# Patient Record
Sex: Female | Born: 1970 | Race: White | Hispanic: No | Smoking: Former smoker
Health system: Southern US, Community
[De-identification: ages and names within clinical notes are randomized; demographics above are authoritative.]

## PROBLEM LIST (undated history)

## (undated) DIAGNOSIS — F32A Depression, unspecified: Secondary | ICD-10-CM

## (undated) DIAGNOSIS — F329 Major depressive disorder, single episode, unspecified: Secondary | ICD-10-CM

## (undated) DIAGNOSIS — F419 Anxiety disorder, unspecified: Secondary | ICD-10-CM

## (undated) HISTORY — PX: TONSILLECTOMY: SUR1361

## (undated) HISTORY — PX: HAND SURGERY: SHX662

---

## 2004-05-21 ENCOUNTER — Ambulatory Visit (HOSPITAL_COMMUNITY): Admission: RE | Admit: 2004-05-21 | Discharge: 2004-05-21 | Payer: Self-pay | Admitting: Gynecology

## 2004-05-22 ENCOUNTER — Ambulatory Visit (HOSPITAL_COMMUNITY): Admission: RE | Admit: 2004-05-22 | Discharge: 2004-05-22 | Payer: Self-pay | Admitting: Gynecology

## 2004-10-07 ENCOUNTER — Emergency Department: Payer: Self-pay | Admitting: Unknown Physician Specialty

## 2005-02-02 ENCOUNTER — Emergency Department: Payer: Self-pay | Admitting: Unknown Physician Specialty

## 2005-07-04 ENCOUNTER — Ambulatory Visit: Payer: Self-pay | Admitting: Internal Medicine

## 2005-07-04 ENCOUNTER — Observation Stay: Payer: Self-pay | Admitting: Surgery

## 2006-08-06 ENCOUNTER — Ambulatory Visit: Payer: Self-pay | Admitting: Internal Medicine

## 2006-08-11 ENCOUNTER — Ambulatory Visit: Payer: Self-pay | Admitting: Internal Medicine

## 2008-06-30 HISTORY — PX: TUBAL LIGATION: SHX77

## 2008-10-19 ENCOUNTER — Encounter: Payer: Self-pay | Admitting: Maternal & Fetal Medicine

## 2008-11-30 ENCOUNTER — Encounter: Payer: Self-pay | Admitting: Obstetrics and Gynecology

## 2009-01-30 ENCOUNTER — Observation Stay: Payer: Self-pay

## 2009-02-19 ENCOUNTER — Ambulatory Visit: Payer: Self-pay | Admitting: Advanced Practice Midwife

## 2009-02-21 ENCOUNTER — Emergency Department: Payer: Self-pay | Admitting: Emergency Medicine

## 2009-02-28 ENCOUNTER — Ambulatory Visit: Payer: Self-pay | Admitting: Advanced Practice Midwife

## 2009-03-25 ENCOUNTER — Observation Stay: Payer: Self-pay | Admitting: Unknown Physician Specialty

## 2009-03-29 ENCOUNTER — Encounter: Payer: Self-pay | Admitting: Obstetrics and Gynecology

## 2009-03-30 ENCOUNTER — Ambulatory Visit: Payer: Self-pay | Admitting: Advanced Practice Midwife

## 2009-04-02 ENCOUNTER — Encounter: Payer: Self-pay | Admitting: Obstetrics and Gynecology

## 2009-04-05 ENCOUNTER — Encounter: Payer: Self-pay | Admitting: Obstetrics & Gynecology

## 2009-04-09 ENCOUNTER — Encounter: Payer: Self-pay | Admitting: Obstetrics & Gynecology

## 2009-04-12 ENCOUNTER — Encounter: Payer: Self-pay | Admitting: Obstetrics and Gynecology

## 2009-04-16 ENCOUNTER — Encounter: Payer: Self-pay | Admitting: Maternal & Fetal Medicine

## 2009-04-19 ENCOUNTER — Inpatient Hospital Stay: Payer: Self-pay

## 2010-05-19 IMAGING — US ULTRAOUND OB LIMITED - [facility]PT [facility]
1 series · 8 of 8 positions shown · non-contrast
Comparison: none

[Series 1: ultraound ob limited - [facility] · 8 of 8 slices shown]
[im 1/8]
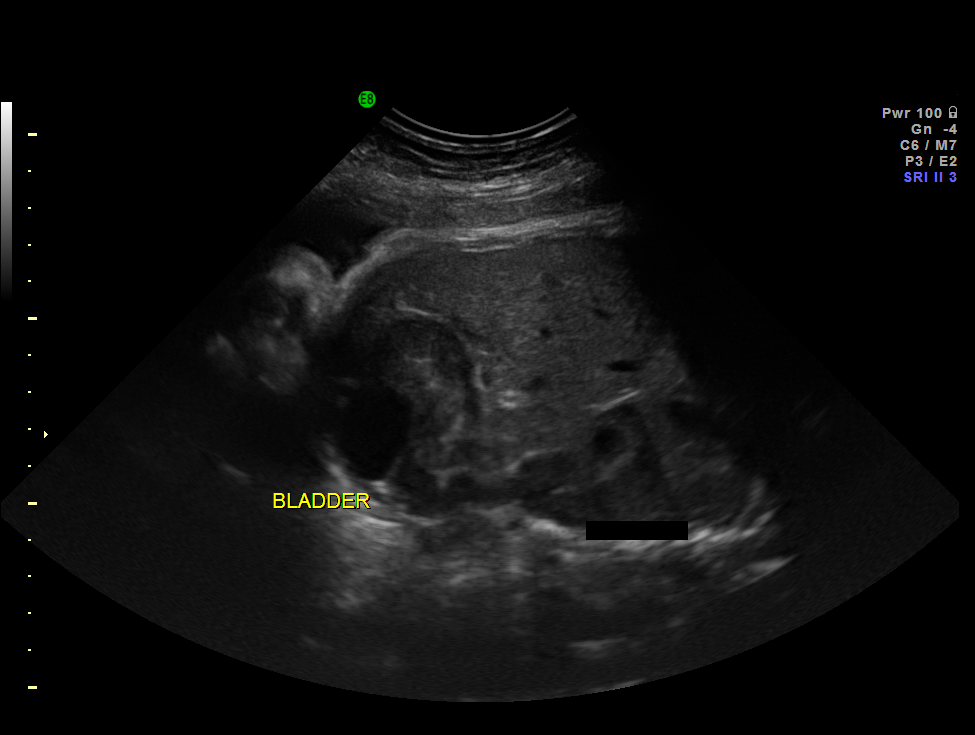
[im 2/8]
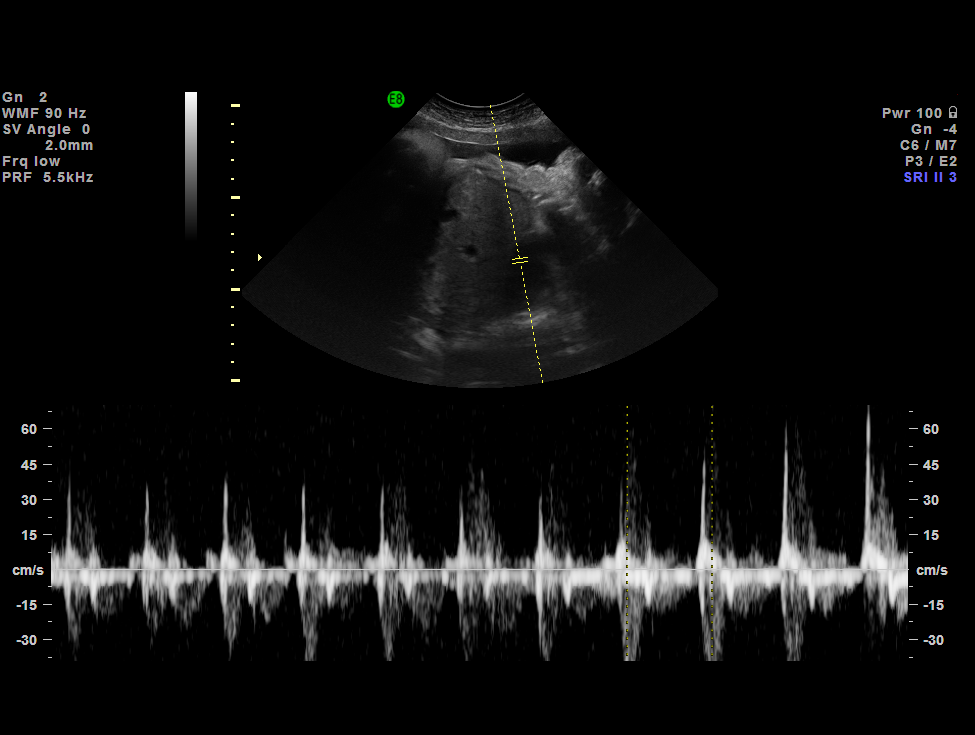
[im 3/8]
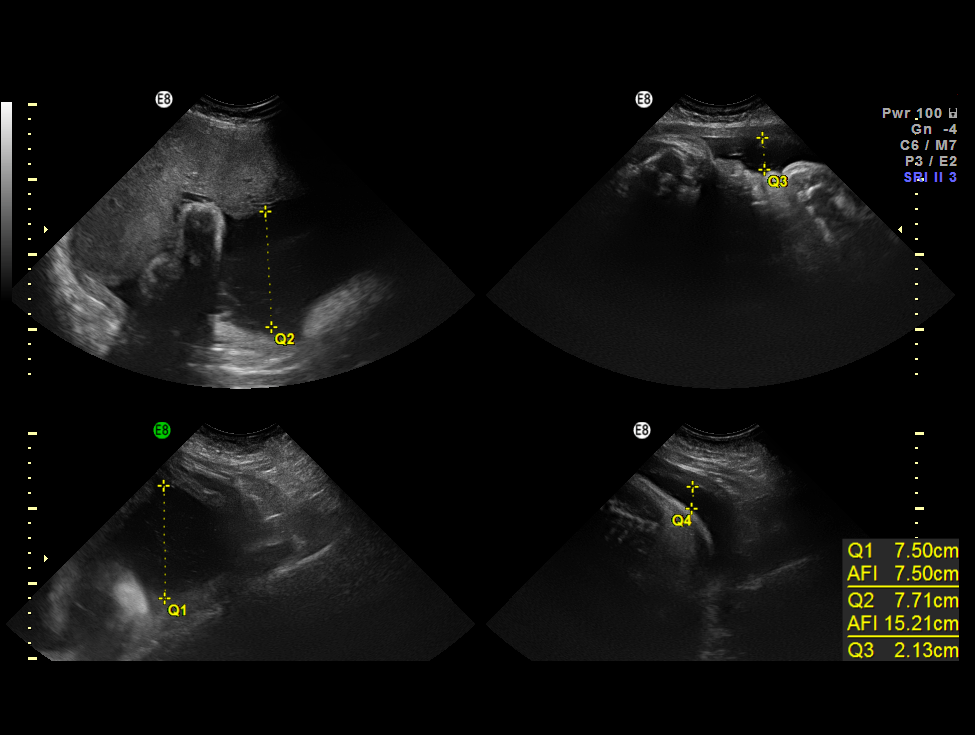
[im 4/8]
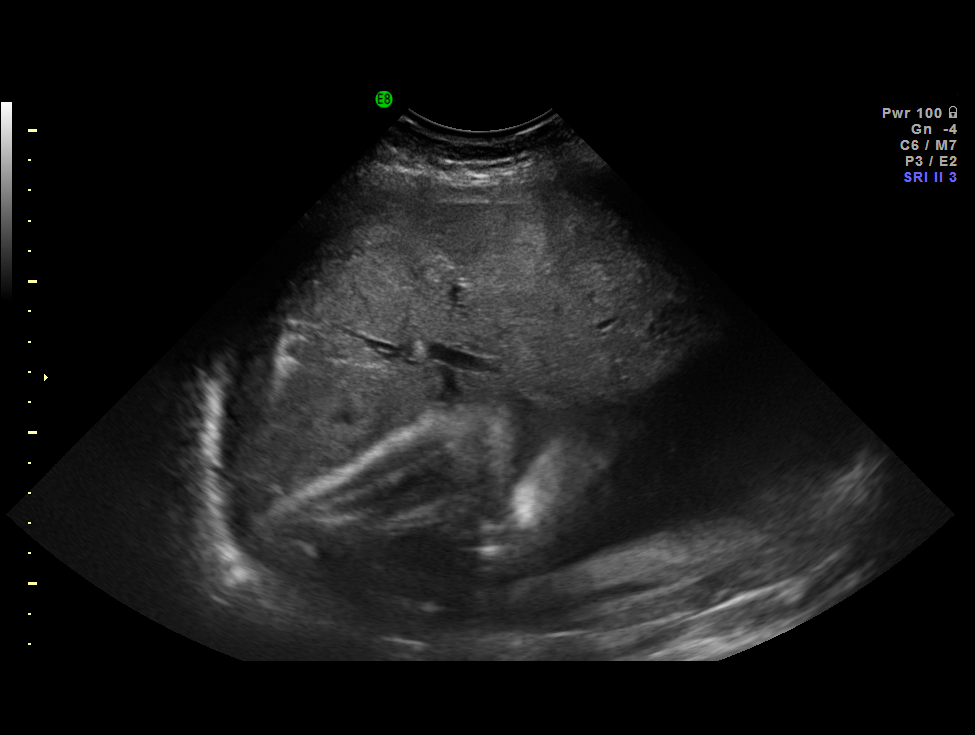
[im 5/8]
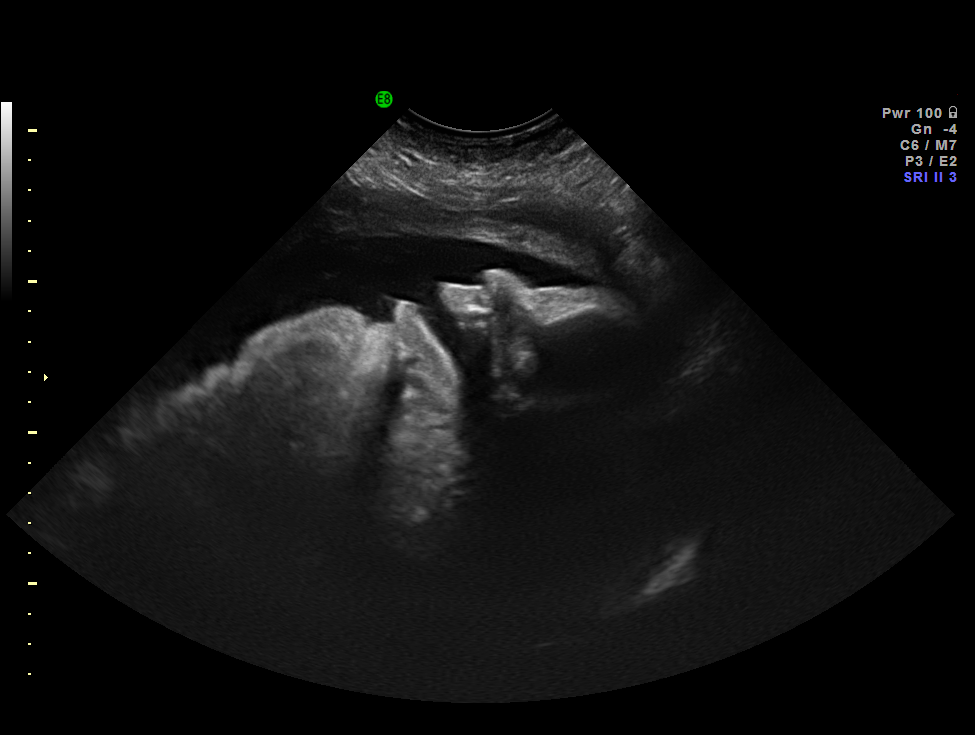
[im 6/8]
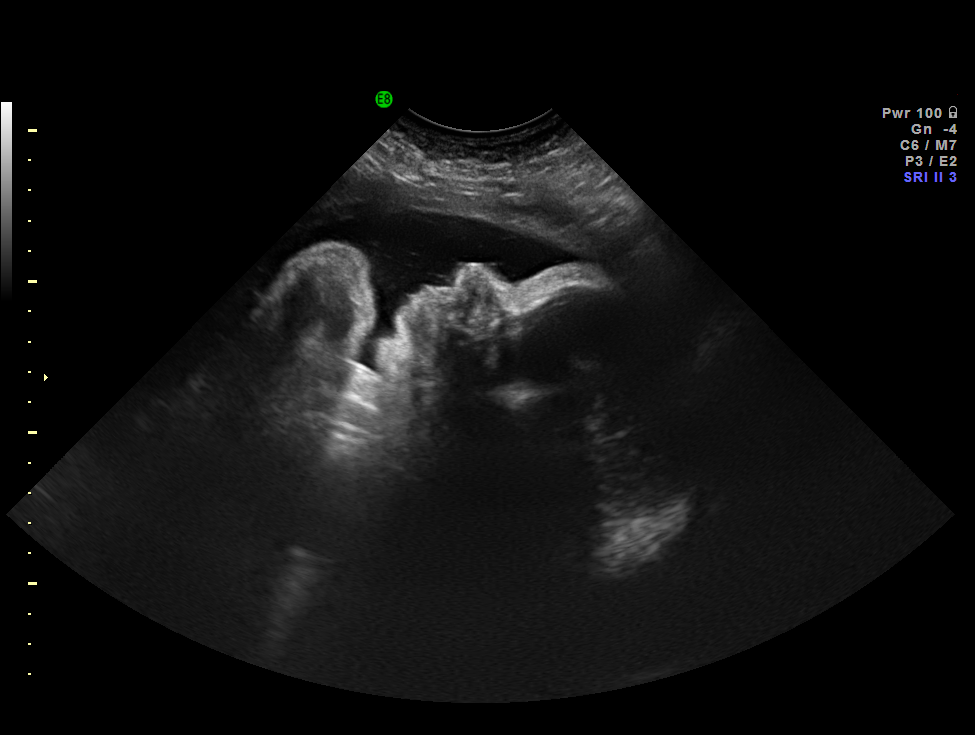
[im 7/8]
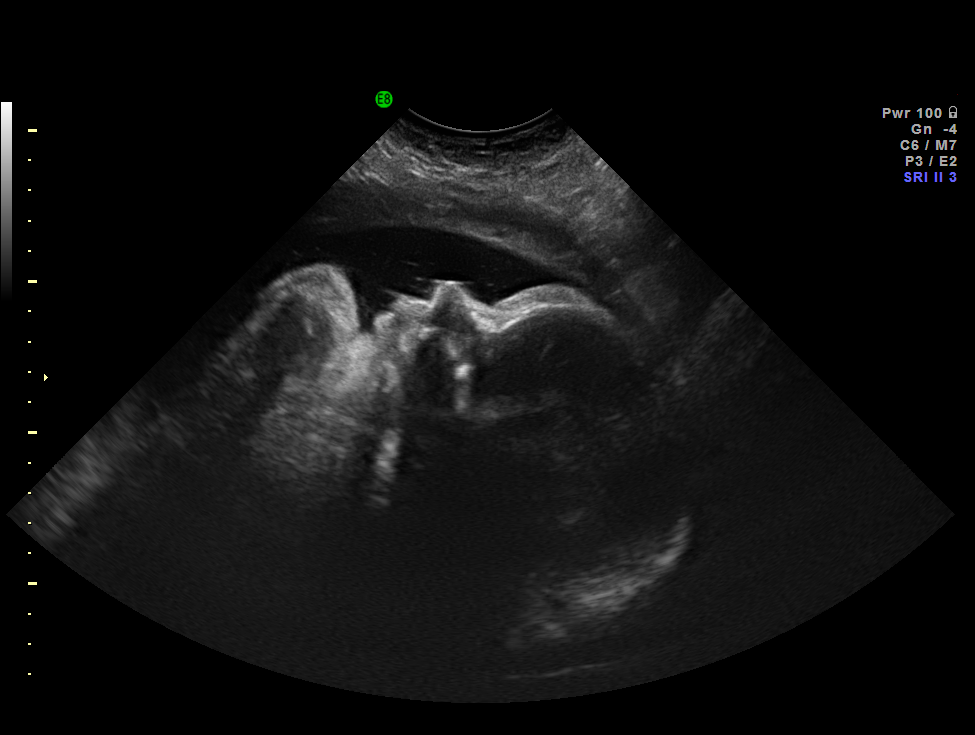
[im 8/8]
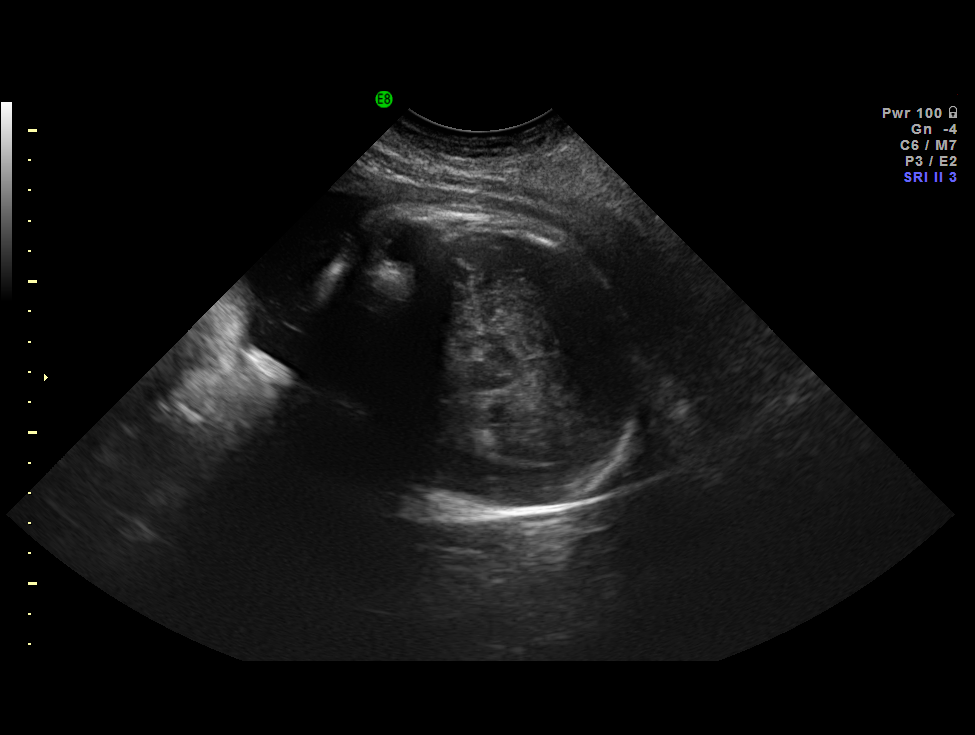

[8 of 8 positions shown; findings below may reference images not displayed]

IMAGES IMPORTED FROM THE SYNGO WORKFLOW SYSTEM
NO DICTATION FOR STUDY

## 2010-05-23 IMAGING — US US OB FOLLOW-UP - [facility]
1 series · 14 of 17 positions shown · non-contrast
Comparison: none

[Series 1: us ob follow-up - [facility] · 14 of 17 slices shown]
[im 1/17]
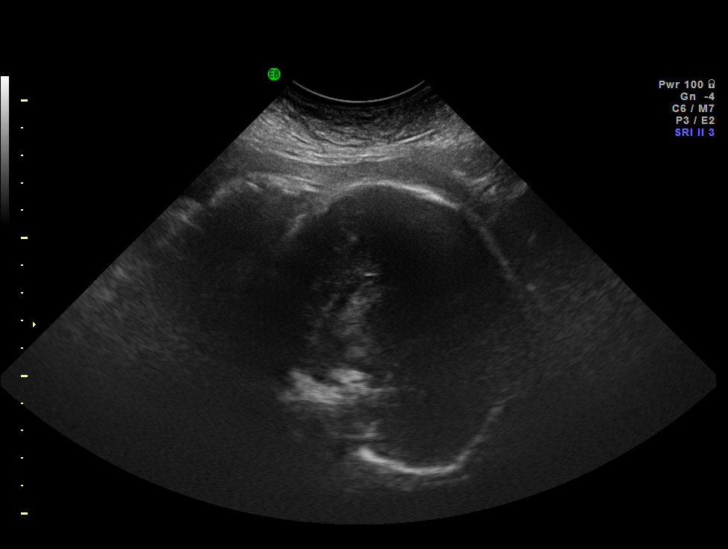
[im 2/17]
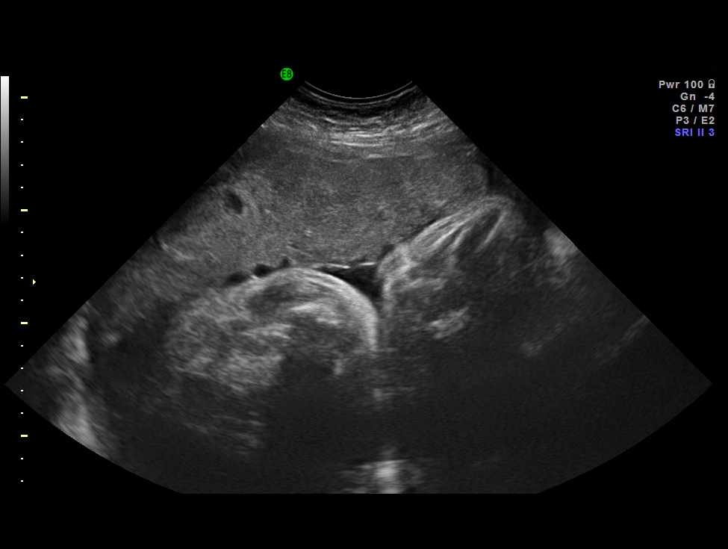
[im 4/17]
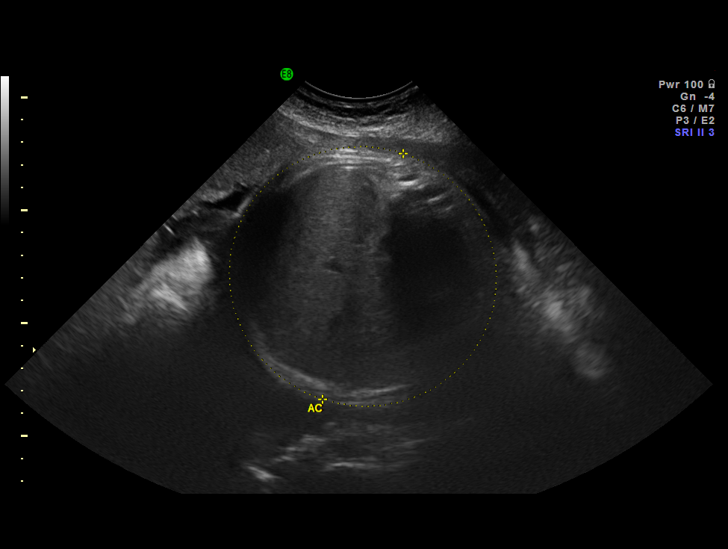
[im 5/17]
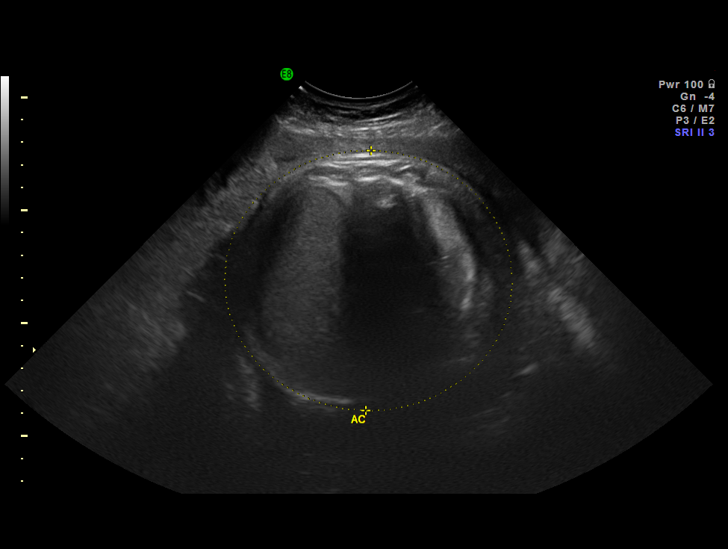
[im 6/17]
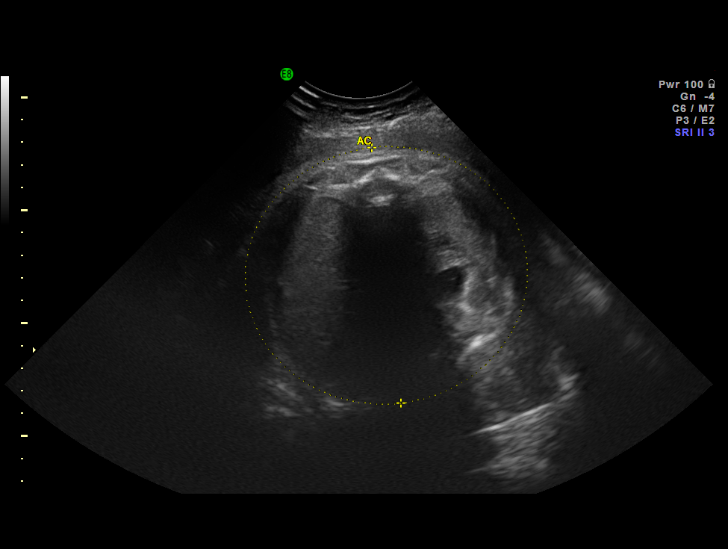
[im 7/17]
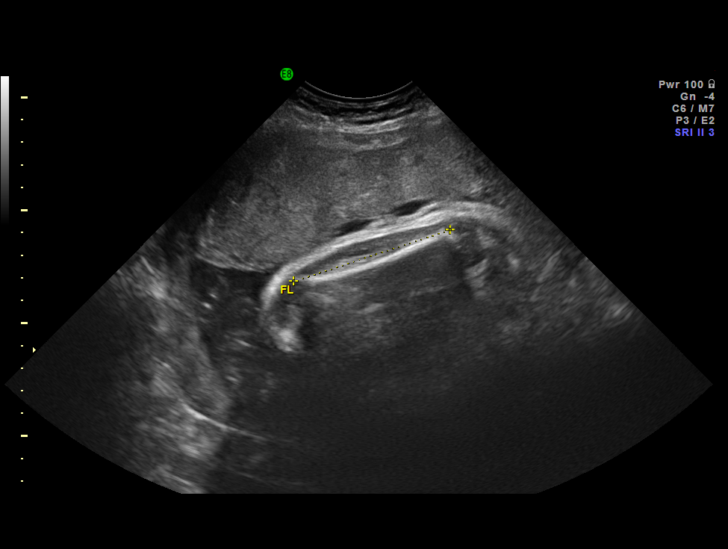
[im 8/17]
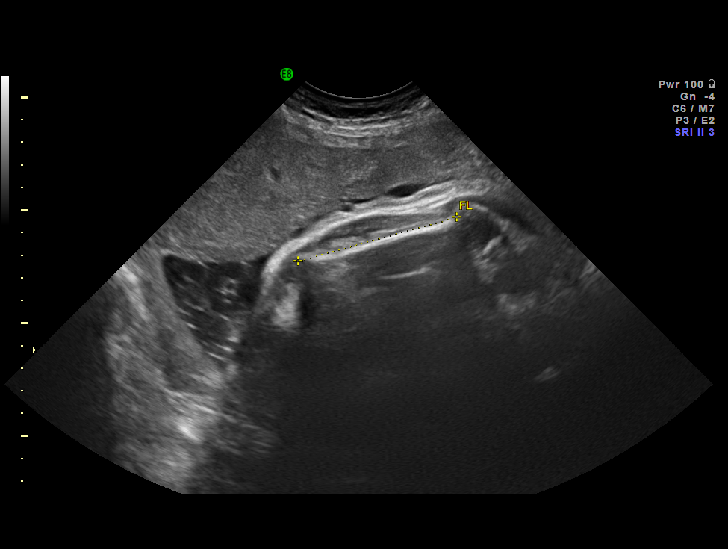
[im 10/17]
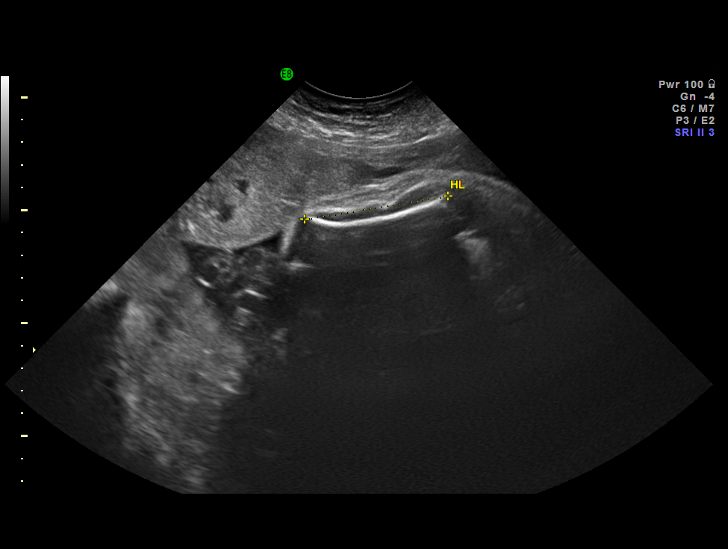
[im 11/17]
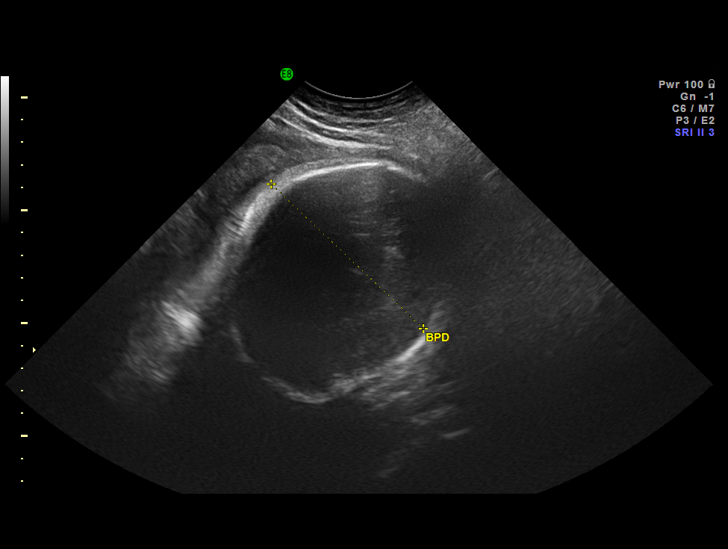
[im 12/17]
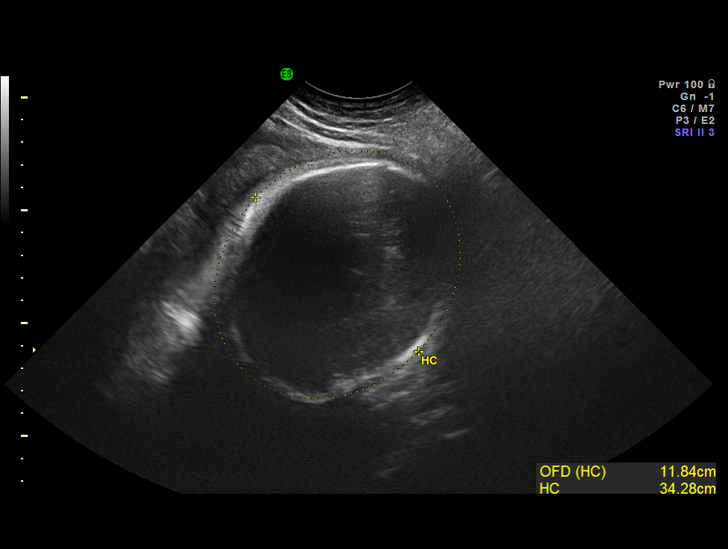
[im 13/17]
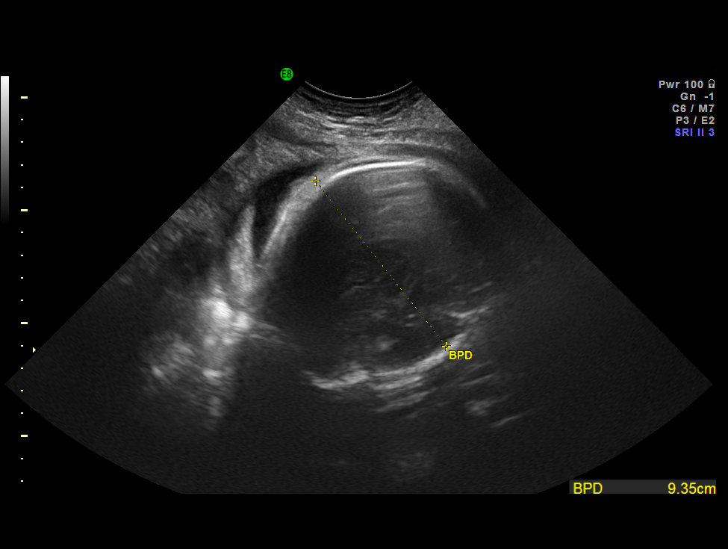
[im 14/17]
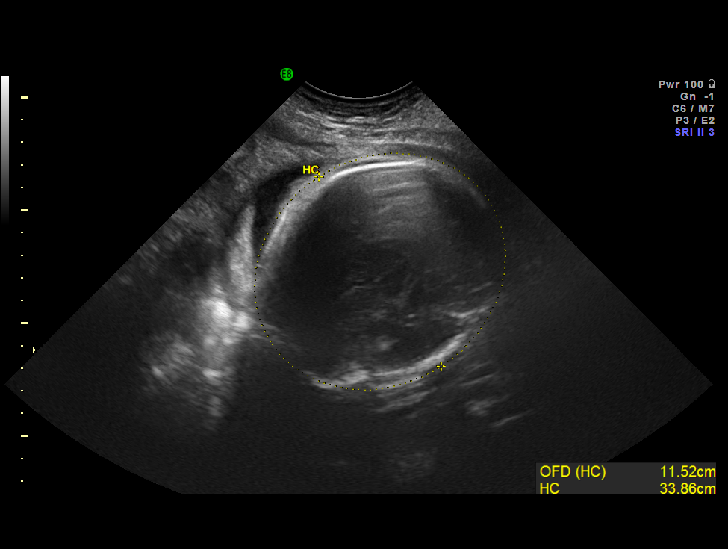
[im 16/17]
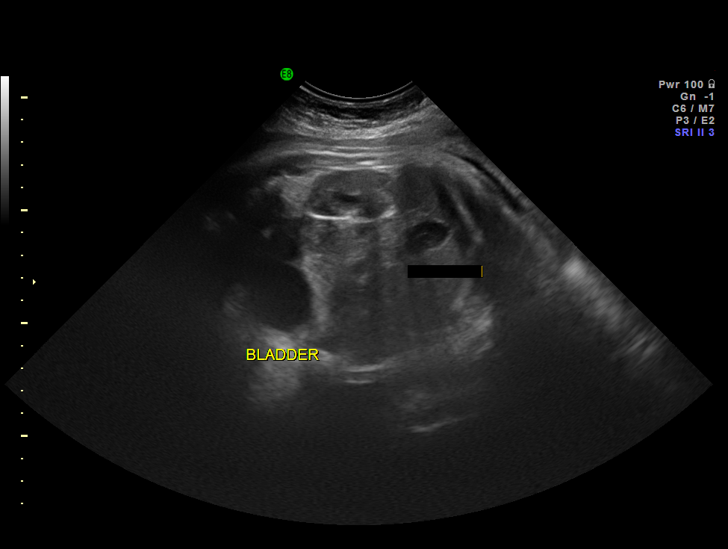
[im 17/17]
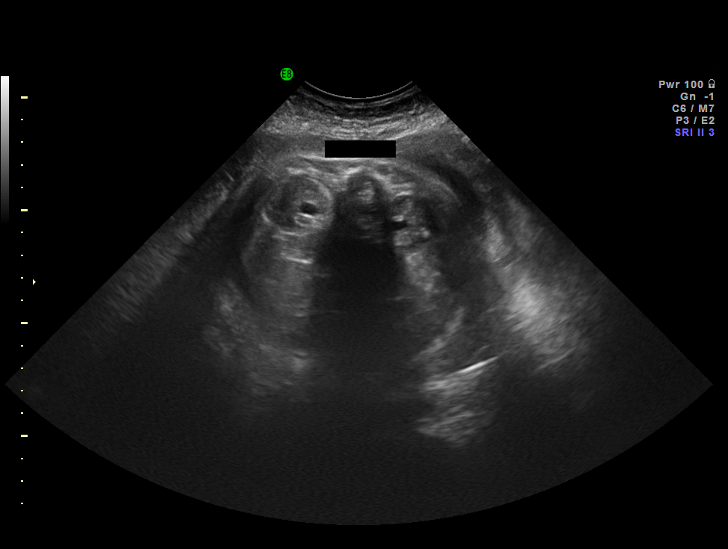

[14 of 17 positions shown; findings below may reference images not displayed]

IMAGES IMPORTED FROM THE SYNGO WORKFLOW SYSTEM
NO DICTATION FOR STUDY

## 2010-07-20 ENCOUNTER — Encounter: Payer: Self-pay | Admitting: Gynecology

## 2010-12-22 ENCOUNTER — Emergency Department: Payer: Self-pay | Admitting: Emergency Medicine

## 2011-04-25 ENCOUNTER — Ambulatory Visit: Payer: Self-pay

## 2011-09-03 ENCOUNTER — Ambulatory Visit: Payer: Self-pay

## 2012-05-03 ENCOUNTER — Other Ambulatory Visit: Payer: Self-pay

## 2012-05-03 LAB — CBC WITH DIFFERENTIAL/PLATELET
Basophil #: 0.1 10*3/uL (ref 0.0–0.1)
Basophil %: 0.7 %
Eosinophil #: 0.1 10*3/uL (ref 0.0–0.7)
Eosinophil %: 1 %
HCT: 41.3 % (ref 35.0–47.0)
HGB: 14.1 g/dL (ref 12.0–16.0)
Lymphocyte #: 1.4 10*3/uL (ref 1.0–3.6)
Lymphocyte %: 17.8 %
MCH: 29.1 pg (ref 26.0–34.0)
MCHC: 34 g/dL (ref 32.0–36.0)
MCV: 86 fL (ref 80–100)
Monocyte #: 0.5 x10 3/mm (ref 0.2–0.9)
Monocyte %: 5.8 %
Neutrophil #: 6 10*3/uL (ref 1.4–6.5)
Neutrophil %: 74.7 %
Platelet: 301 10*3/uL (ref 150–440)
RBC: 4.83 10*6/uL (ref 3.80–5.20)
RDW: 12.9 % (ref 11.5–14.5)
WBC: 8.1 10*3/uL (ref 3.6–11.0)

## 2012-05-03 LAB — COMPREHENSIVE METABOLIC PANEL
Albumin: 3.9 g/dL (ref 3.4–5.0)
Alkaline Phosphatase: 107 U/L (ref 50–136)
Anion Gap: 9 (ref 7–16)
BUN: 10 mg/dL (ref 7–18)
Bilirubin,Total: 0.6 mg/dL (ref 0.2–1.0)
Calcium, Total: 8.8 mg/dL (ref 8.5–10.1)
Chloride: 106 mmol/L (ref 98–107)
Co2: 26 mmol/L (ref 21–32)
Creatinine: 0.58 mg/dL — ABNORMAL LOW (ref 0.60–1.30)
EGFR (African American): >60
EGFR (Non-African Amer.): >60
Glucose: 91 mg/dL (ref 65–99)
Osmolality: 280 (ref 275–301)
Potassium: 3.6 mmol/L (ref 3.5–5.1)
SGOT(AST): 22 U/L (ref 15–37)
SGPT (ALT): 18 U/L (ref 12–78)
Sodium: 141 mmol/L (ref 136–145)
Total Protein: 7.6 g/dL (ref 6.4–8.2)

## 2012-05-03 LAB — LIPID PANEL
Cholesterol: 223 mg/dL — ABNORMAL HIGH (ref 0–200)
HDL Cholesterol: 65 mg/dL — ABNORMAL HIGH (ref 40–60)
Ldl Cholesterol, Calc: 145 mg/dL — ABNORMAL HIGH (ref 0–100)
Triglycerides: 67 mg/dL (ref 0–200)
VLDL Cholesterol, Calc: 13 mg/dL (ref 5–40)

## 2012-05-03 LAB — TSH: Thyroid Stimulating Horm: 2.75 u[IU]/mL

## 2013-10-28 ENCOUNTER — Emergency Department: Payer: Self-pay | Admitting: Emergency Medicine

## 2013-10-31 LAB — BETA STREP CULTURE(ARMC)

## 2013-11-01 ENCOUNTER — Emergency Department: Payer: Self-pay | Admitting: Emergency Medicine

## 2013-11-01 LAB — URINALYSIS, COMPLETE
Bacteria: NONE SEEN
Bilirubin,UR: NEGATIVE
Glucose,UR: NEGATIVE mg/dL (ref 0–75)
Ketone: NEGATIVE
Nitrite: NEGATIVE
Ph: 5 (ref 4.5–8.0)
Protein: 100
RBC,UR: 800 /HPF (ref 0–5)
Specific Gravity: 1.012 (ref 1.003–1.030)
Squamous Epithelial: 2
WBC UR: 149 /HPF (ref 0–5)

## 2013-11-01 LAB — CBC
HCT: 39.6 % (ref 35.0–47.0)
HGB: 13.8 g/dL (ref 12.0–16.0)
MCH: 30.4 pg (ref 26.0–34.0)
MCHC: 34.8 g/dL (ref 32.0–36.0)
MCV: 87 fL (ref 80–100)
Platelet: 278 10*3/uL (ref 150–440)
RBC: 4.52 10*6/uL (ref 3.80–5.20)
RDW: 13.1 % (ref 11.5–14.5)
WBC: 12.9 10*3/uL — ABNORMAL HIGH (ref 3.6–11.0)

## 2013-11-01 LAB — COMPREHENSIVE METABOLIC PANEL
Albumin: 3.7 g/dL (ref 3.4–5.0)
Alkaline Phosphatase: 166 U/L — ABNORMAL HIGH
Anion Gap: 5 — ABNORMAL LOW (ref 7–16)
BUN: 15 mg/dL (ref 7–18)
Bilirubin,Total: 0.4 mg/dL (ref 0.2–1.0)
Calcium, Total: 8.9 mg/dL (ref 8.5–10.1)
Chloride: 107 mmol/L (ref 98–107)
Co2: 26 mmol/L (ref 21–32)
Creatinine: 0.98 mg/dL (ref 0.60–1.30)
EGFR (African American): >60
EGFR (Non-African Amer.): >60
Glucose: 115 mg/dL — ABNORMAL HIGH (ref 65–99)
Osmolality: 277 (ref 275–301)
Potassium: 4 mmol/L (ref 3.5–5.1)
SGOT(AST): 275 U/L — ABNORMAL HIGH (ref 15–37)
SGPT (ALT): 174 U/L — ABNORMAL HIGH (ref 12–78)
Sodium: 138 mmol/L (ref 136–145)
Total Protein: 7.1 g/dL (ref 6.4–8.2)

## 2013-11-07 ENCOUNTER — Emergency Department: Payer: Self-pay | Admitting: Emergency Medicine

## 2013-11-07 LAB — CBC WITH DIFFERENTIAL/PLATELET
Basophil #: 0.1 10*3/uL (ref 0.0–0.1)
Basophil %: 0.5 %
Eosinophil #: 0.1 10*3/uL (ref 0.0–0.7)
Eosinophil %: 0.5 %
HCT: 39.2 % (ref 35.0–47.0)
HGB: 13.5 g/dL (ref 12.0–16.0)
Lymphocyte #: 0.6 10*3/uL — ABNORMAL LOW (ref 1.0–3.6)
Lymphocyte %: 3.8 %
MCH: 30 pg (ref 26.0–34.0)
MCHC: 34.5 g/dL (ref 32.0–36.0)
MCV: 87 fL (ref 80–100)
Monocyte #: 1 x10 3/mm — ABNORMAL HIGH (ref 0.2–0.9)
Monocyte %: 6.5 %
Neutrophil #: 13.3 10*3/uL — ABNORMAL HIGH (ref 1.4–6.5)
Neutrophil %: 88.7 %
Platelet: 315 10*3/uL (ref 150–440)
RBC: 4.51 10*6/uL (ref 3.80–5.20)
RDW: 12.8 % (ref 11.5–14.5)
WBC: 15 10*3/uL — ABNORMAL HIGH (ref 3.6–11.0)

## 2013-11-07 LAB — URINALYSIS, COMPLETE
Bacteria: NONE SEEN
Bilirubin,UR: NEGATIVE
Glucose,UR: NEGATIVE mg/dL (ref 0–75)
Ketone: NEGATIVE
Nitrite: NEGATIVE
Ph: 6 (ref 4.5–8.0)
Protein: NEGATIVE
RBC,UR: 3 /HPF (ref 0–5)
Specific Gravity: 1.011 (ref 1.003–1.030)
Squamous Epithelial: 3
WBC UR: 24 /HPF (ref 0–5)

## 2013-11-07 LAB — COMPREHENSIVE METABOLIC PANEL
Albumin: 3.6 g/dL (ref 3.4–5.0)
Alkaline Phosphatase: 159 U/L — ABNORMAL HIGH
Anion Gap: 7 (ref 7–16)
BUN: 16 mg/dL (ref 7–18)
Bilirubin,Total: 0.4 mg/dL (ref 0.2–1.0)
Calcium, Total: 8.8 mg/dL (ref 8.5–10.1)
Chloride: 105 mmol/L (ref 98–107)
Co2: 25 mmol/L (ref 21–32)
Creatinine: 1.04 mg/dL (ref 0.60–1.30)
EGFR (African American): >60
EGFR (Non-African Amer.): >60
Glucose: 119 mg/dL — ABNORMAL HIGH (ref 65–99)
Osmolality: 276 (ref 275–301)
Potassium: 4 mmol/L (ref 3.5–5.1)
SGOT(AST): 120 U/L — ABNORMAL HIGH (ref 15–37)
SGPT (ALT): 76 U/L (ref 12–78)
Sodium: 137 mmol/L (ref 136–145)
Total Protein: 7.4 g/dL (ref 6.4–8.2)

## 2014-07-03 ENCOUNTER — Ambulatory Visit: Payer: Self-pay

## 2014-11-04 ENCOUNTER — Encounter: Payer: Self-pay | Admitting: Emergency Medicine

## 2014-11-04 ENCOUNTER — Emergency Department
Admission: EM | Admit: 2014-11-04 | Discharge: 2014-11-04 | Disposition: A | Discharge status: Home / Self Care | Payer: Self-pay | Attending: Emergency Medicine | Admitting: Emergency Medicine

## 2014-11-04 DIAGNOSIS — Z87891 Personal history of nicotine dependence: Secondary | ICD-10-CM | POA: Insufficient documentation

## 2014-11-04 DIAGNOSIS — F419 Anxiety disorder, unspecified: Secondary | ICD-10-CM | POA: Insufficient documentation

## 2014-11-04 DIAGNOSIS — Z3202 Encounter for pregnancy test, result negative: Secondary | ICD-10-CM | POA: Insufficient documentation

## 2014-11-04 DIAGNOSIS — F331 Major depressive disorder, recurrent, moderate: Secondary | ICD-10-CM

## 2014-11-04 DIAGNOSIS — F329 Major depressive disorder, single episode, unspecified: Secondary | ICD-10-CM | POA: Insufficient documentation

## 2014-11-04 HISTORY — DX: Anxiety disorder, unspecified: F41.9

## 2014-11-04 HISTORY — DX: Depression, unspecified: F32.A

## 2014-11-04 HISTORY — DX: Major depressive disorder, single episode, unspecified: F32.9

## 2014-11-04 LAB — COMPREHENSIVE METABOLIC PANEL
ALT: 16 U/L (ref 14–54)
AST: 19 U/L (ref 15–41)
Albumin: 4.3 g/dL (ref 3.5–5.0)
Alkaline Phosphatase: 71 U/L (ref 38–126)
Anion gap: 6 (ref 5–15)
BUN: 10 mg/dL (ref 6–20)
CO2: 30 mmol/L (ref 22–32)
Calcium: 9.4 mg/dL (ref 8.9–10.3)
Chloride: 105 mmol/L (ref 101–111)
Creatinine, Ser: 0.65 mg/dL (ref 0.44–1.00)
GFR calc Af Amer: >60 mL/min (ref >60)
GFR calc non Af Amer: >60 mL/min (ref >60)
Glucose, Bld: 102 mg/dL — ABNORMAL HIGH (ref 65–99)
Potassium: 4 mmol/L (ref 3.5–5.1)
Sodium: 141 mmol/L (ref 135–145)
Total Bilirubin: 0.4 mg/dL (ref 0.3–1.2)
Total Protein: 7.3 g/dL (ref 6.5–8.1)

## 2014-11-04 LAB — CBC
HCT: 42.8 % (ref 35.0–47.0)
Hemoglobin: 14.4 g/dL (ref 12.0–16.0)
MCH: 29.6 pg (ref 26.0–34.0)
MCHC: 33.7 g/dL (ref 32.0–36.0)
MCV: 87.9 fL (ref 80.0–100.0)
Platelets: 293 10*3/uL (ref 150–440)
RBC: 4.87 MIL/uL (ref 3.80–5.20)
RDW: 12.7 % (ref 11.5–14.5)
WBC: 7.6 10*3/uL (ref 3.6–11.0)

## 2014-11-04 LAB — SALICYLATE LEVEL: Salicylate Lvl: <4 mg/dL (ref 2.8–30.0)

## 2014-11-04 LAB — ACETAMINOPHEN LEVEL: Acetaminophen (Tylenol), Serum: <10 ug/mL — ABNORMAL LOW (ref 10–30)

## 2014-11-04 LAB — ETHANOL: Alcohol, Ethyl (B): <5 mg/dL (ref <5)

## 2014-11-04 LAB — PREGNANCY, URINE: Preg Test, Ur: NEGATIVE

## 2014-11-04 MED ORDER — SERTRALINE HCL 50 MG PO TABS: 50.0000 mg | ORAL_TABLET | Freq: Every day | ORAL | Status: DC

## 2014-11-04 NOTE — ED Notes (Signed)
Pt given dinner tray at this time, pt verbalized no further needs at this time, no acute distress noted.

## 2014-11-04 NOTE — ED Provider Notes (Signed)
[facility] Emergency Department Provider Note  ____________________________________________  Time seen:945  I have reviewed the triage vital signs and the nursing notes.   HISTORY  Chief Complaint Depression and Anxiety     HPI Chloe GrovesMichelle B Morgan is a 44 y.o. female patient reports history of depression since middle school and is been on medications as 1995 divorced fairly recently and become more depressed in the last week she took herself off her medications approximately 3 months ago she reports because she couldn't remember to take them but now is becoming very depressed and having a series of bad relationships denies any medical problems any fever chills nausea vomiting diarrhea or pain anywhere anything else going on reports she is not homicidal or suicidal     Past Medical History  Diagnosis Date  . Anxiety   . Depression     There are no active problems to display for this patient.   Past Surgical History  Procedure Laterality Date  . Tonsillectomy    . Tubal ligation  2010    No current outpatient prescriptions on file.  Allergies Review of patient's allergies indicates no known allergies.  Family History  Problem Relation Age of Onset  . Cancer Mother   . Bipolar disorder Mother   . Breast cancer Mother   . Cancer Father     Social History History  Substance Use Topics  . Smoking status: Former Smoker    Quit date: 11/03/1996  . Smokeless tobacco: Not on file  . Alcohol Use: Yes     Comment: once or twice a month    Review of Systems  Constitutional: Negative for fever. Eyes: Negative for visual changes. ENT: Negative for sore throat. Cardiovascular: Negative for chest pain. Respiratory: Negative for shortness of breath. Gastrointestinal: Negative for abdominal pain, vomiting and diarrhea. Genitourinary: Negative for dysuria. Musculoskeletal: Negative for back pain. Skin: Negative for rash. Neurological: Negative  for headaches, focal weakness or numbness. Psychiatric covered in history of present illness  10-point ROS otherwise negative.  ____________________________________________   PHYSICAL EXAM:  VITAL SIGNS: ED Triage Vitals  Enc Vitals Group     BP 11/04/14 0740 145/93 mmHg     Pulse Rate 11/04/14 0740 82     Resp 11/04/14 0740 16     Temp 11/04/14 0740 97.3 F (36.3 C)     Temp Source 11/04/14 0740 Oral     SpO2 11/04/14 0740 98 %     Weight 11/04/14 0740 170 lb (77.111 kg)     Height 11/04/14 0740 5\' 6"  (1.676 m)     Head Cir --      Peak Flow --      Pain Score 11/04/14 0742 0     Pain Loc --      Pain Edu? --      Excl. in GC? --     Constitutional: Alert and oriented. Well appearing and in no distress. Eyes: Conjunctivae are normal. PERRL. Normal extraocular movements. ENT   Head: Normocephalic and atraumatic.   Nose: No congestion/rhinnorhea.   Mouth/Throat: Mucous membranes are moist.   Neck: No stridor. Hematological/Lymphatic/Immunilogical: No cervical lymphadenopathy. Cardiovascular: Normal rate, regular rhythm. Normal and symmetric distal pulses are present in all extremities. No murmurs, rubs, or gallops. Respiratory: Normal respiratory effort without tachypnea nor retractions. Breath sounds are clear and equal bilaterally. No wheezes/rales/rhonchi. Gastrointestinal: Soft and nontender. No distention. No abdominal bruits. There is no CVA tenderness. Musculoskeletal: Nontender with normal range of motion in all  extremities. No joint effusions.  No lower extremity tenderness nor edema. Neurologic:  Normal speech and language. No gross focal neurologic deficits are appreciated. Speech is normal.  Skin:  Skin is warm, dry and intact. No rash noted.  ____________________________________________    LABS (pertinent positives/negatives)    ____________________________________________   EKG    ____________________________________________     RADIOLOGY    ____________________________________________   PROCEDURES  Procedure(s) performed: None  Critical Care performed: No  ____________________________________________   INITIAL IMPRESSION / ASSESSMENT AND PLAN / ED COURSE  Pertinent labs & imaging results that were available during my care of the patient were reviewed by me and considered in my medical decision making (see chart for details).   ____________________________________________   FINAL CLINICAL IMPRESSION(S) / ED DIAGNOSES  Final diagnoses:  MDD (major depressive disorder), recurrent episode, moderate    Arnaldo NatalPaul F Delvis Kau, MD 11/08/14 1551

## 2014-11-04 NOTE — ED Notes (Signed)
BEHAVIORAL HEALTH ROUNDING Patient sleeping: Yes.   Patient alert and oriented: yes Behavior appropriate: Yes.  ;  Nutrition and fluids offered: Yes  Toileting and hygiene offered: Yes  Sitter present: yes Law enforcement present: Yes  
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
## 2014-11-04 NOTE — Discharge Instructions (Signed)
Major Depressive Disorder °Major depressive disorder is a mental illness. It also may be called clinical depression or unipolar depression. Major depressive disorder usually causes feelings of sadness, hopelessness, or helplessness. Some people with this disorder do not feel particularly sad but lose interest in doing things they used to enjoy (anhedonia). Major depressive disorder also can cause physical symptoms. It can interfere with work, school, relationships, and other normal everyday activities. The disorder varies in severity but is longer lasting and more serious than the sadness we all feel from time to time in our lives. °Major depressive disorder often is triggered by stressful life events or major life changes. Examples of these triggers include divorce, loss of your job or home, a move, and the death of a family member or close friend. Sometimes this disorder occurs for no obvious reason at all. People who have family members with major depressive disorder or bipolar disorder are at higher risk for developing this disorder, with or without life stressors. Major depressive disorder can occur at any age. It may occur just once in your life (single episode major depressive disorder). It may occur multiple times (recurrent major depressive disorder). °SYMPTOMS °People with major depressive disorder have either anhedonia or depressed mood on nearly a daily basis for at least 2 weeks or longer. Symptoms of depressed mood include: °· Feelings of sadness (blue or down in the dumps) or emptiness. °· Feelings of hopelessness or helplessness. °· Tearfulness or episodes of crying (may be observed by others). °· Irritability (children and adolescents). °In addition to depressed mood or anhedonia or both, people with this disorder have at least four of the following symptoms: °· Difficulty sleeping or sleeping too much.   °· Significant change (increase or decrease) in appetite or weight.   °· Lack of energy or  motivation. °· Feelings of guilt and worthlessness.   °· Difficulty concentrating, remembering, or making decisions. °· Unusually slow movement (psychomotor retardation) or restlessness (as observed by others).   °· Recurrent wishes for death, recurrent thoughts of self-harm (suicide), or a suicide attempt. °People with major depressive disorder commonly have persistent negative thoughts about themselves, other people, and the world. People with severe major depressive disorder may experience distorted beliefs or perceptions about the world (psychotic delusions). They also may see or hear things that are not real (psychotic hallucinations). °DIAGNOSIS °Major depressive disorder is diagnosed through an assessment by your health care provider. Your health care provider will ask about aspects of your daily life, such as mood, sleep, and appetite, to see if you have the diagnostic symptoms of major depressive disorder. Your health care provider may ask about your medical history and use of alcohol or drugs, including prescription medicines. Your health care provider also may do a physical exam and blood work. This is because certain medical conditions and the use of certain substances can cause major depressive disorder-like symptoms (secondary depression). Your health care provider also may refer you to a mental health specialist for further evaluation and treatment. °TREATMENT °It is important to recognize the symptoms of major depressive disorder and seek treatment. The following treatments can be prescribed for this disorder:   °· Medicine. Antidepressant medicines usually are prescribed. Antidepressant medicines are thought to correct chemical imbalances in the brain that are commonly associated with major depressive disorder. Other types of medicine may be added if the symptoms do not respond to antidepressant medicines alone or if psychotic delusions or hallucinations occur. °· Talk therapy. Talk therapy can be  helpful in treating major depressive disorder by providing   support, education, and guidance. Certain types of talk therapy also can help with negative thinking (cognitive behavioral therapy) and with relationship issues that trigger this disorder (interpersonal therapy). °A mental health specialist can help determine which treatment is best for you. Most people with major depressive disorder do well with a combination of medicine and talk therapy. Treatments involving electrical stimulation of the brain can be used in situations with extremely severe symptoms or when medicine and talk therapy do not work over time. These treatments include electroconvulsive therapy, transcranial magnetic stimulation, and vagal nerve stimulation. °Document Released: 10/11/2012 Document Revised: 10/31/2013 Document Reviewed: 10/11/2012 °[employer]® Patient Information ©2015 [employer]. This information is not intended to replace advice given to you by your health care provider. Make sure you discuss any questions you have with your health care provider. ° °

## 2014-11-04 NOTE — ED Notes (Signed)
BEHAVIORAL HEALTH ROUNDING Patient sleeping: No. Patient alert and oriented: yes Behavior appropriate: Yes.  ;  Nutrition and fluids offered: Yes  Toileting and hygiene offered: Yes  Sitter present: yes Law enforcement present: Yes  

## 2014-11-04 NOTE — ED Notes (Signed)
Psych MD at bedside

## 2014-11-04 NOTE — ED Notes (Signed)
BEHAVIORAL HEALTH ROUNDING Patient sleeping: Yes.   Patient alert and oriented: not applicable Behavior appropriate: Yes.   Nutrition and fluids offered: Yes  Toileting and hygiene offered: Yes  Sitter present: yes Law enforcement present: Yes  

## 2014-11-04 NOTE — ED Notes (Signed)

## 2014-11-04 NOTE — ED Provider Notes (Signed)
-----------------------------------------   9:01 PM on 11/04/2014 -----------------------------------------  Consult note from psychiatry Dr. Jenne CampusMcQueen reviewed. We'll restart patient on Zoloft 50 daily at bedtime. We'll have her follow-up with [facility]. She is medically stable at this time. Will be discharged.  Sharman CheekPhillip Trust Crago, MD 11/04/14 2102

## 2014-11-04 NOTE — ED Notes (Signed)
Patient presents to the ED with depression and anxiety.  Patient reports multiple life stressors including, divorce, unhealthy relationships, homelessness, and low paying job with low job security.  Patient denies suicidal thoughts.  Patient denies homicidal thoughts.  Patient reports stopping taking anti-depressant zoloft about 3 months ago.
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
## 2014-11-13 ENCOUNTER — Emergency Department
Admission: EM | Admit: 2014-11-13 | Discharge: 2014-11-13 | Disposition: A | Discharge status: Home / Self Care | Payer: Self-pay | Attending: Emergency Medicine | Admitting: Emergency Medicine

## 2014-11-13 ENCOUNTER — Encounter: Payer: Self-pay | Admitting: *Deleted

## 2014-11-13 DIAGNOSIS — F32A Depression, unspecified: Secondary | ICD-10-CM

## 2014-11-13 DIAGNOSIS — F322 Major depressive disorder, single episode, severe without psychotic features: Secondary | ICD-10-CM | POA: Insufficient documentation

## 2014-11-13 DIAGNOSIS — Z87891 Personal history of nicotine dependence: Secondary | ICD-10-CM | POA: Insufficient documentation

## 2014-11-13 DIAGNOSIS — F329 Major depressive disorder, single episode, unspecified: Secondary | ICD-10-CM | POA: Insufficient documentation

## 2014-11-13 LAB — COMPREHENSIVE METABOLIC PANEL
ALT: 13 U/L — ABNORMAL LOW (ref 14–54)
AST: 17 U/L (ref 15–41)
Albumin: 4.3 g/dL (ref 3.5–5.0)
Alkaline Phosphatase: 67 U/L (ref 38–126)
Anion gap: 7 (ref 5–15)
BUN: 12 mg/dL (ref 6–20)
CO2: 28 mmol/L (ref 22–32)
Calcium: 9.3 mg/dL (ref 8.9–10.3)
Chloride: 104 mmol/L (ref 101–111)
Creatinine, Ser: 0.65 mg/dL (ref 0.44–1.00)
GFR calc Af Amer: >60 mL/min (ref >60)
GFR calc non Af Amer: >60 mL/min (ref >60)
Glucose, Bld: 112 mg/dL — ABNORMAL HIGH (ref 65–99)
Potassium: 3.5 mmol/L (ref 3.5–5.1)
Sodium: 139 mmol/L (ref 135–145)
Total Bilirubin: 1.1 mg/dL (ref 0.3–1.2)
Total Protein: 7.3 g/dL (ref 6.5–8.1)

## 2014-11-13 LAB — ETHANOL: Alcohol, Ethyl (B): <5 mg/dL (ref <5)

## 2014-11-13 LAB — CBC
HCT: 42 % (ref 35.0–47.0)
Hemoglobin: 14.6 g/dL (ref 12.0–16.0)
MCH: 30.3 pg (ref 26.0–34.0)
MCHC: 34.7 g/dL (ref 32.0–36.0)
MCV: 87.1 fL (ref 80.0–100.0)
Platelets: 321 10*3/uL (ref 150–440)
RBC: 4.82 MIL/uL (ref 3.80–5.20)
RDW: 12.6 % (ref 11.5–14.5)
WBC: 9.1 10*3/uL (ref 3.6–11.0)

## 2014-11-13 LAB — ACETAMINOPHEN LEVEL: Acetaminophen (Tylenol), Serum: <10 ug/mL — ABNORMAL LOW (ref 10–30)

## 2014-11-13 LAB — SALICYLATE LEVEL: Salicylate Lvl: <4 mg/dL (ref 2.8–30.0)

## 2014-11-13 MED ORDER — TRAZODONE HCL 50 MG PO TABS: 50.0000 mg | ORAL_TABLET | Freq: Every day | ORAL | Status: DC

## 2014-11-13 MED ORDER — SERTRALINE HCL 100 MG PO TABS: 100.0000 mg | ORAL_TABLET | Freq: Every day | ORAL | Status: DC

## 2014-11-13 MED ORDER — TRAZODONE HCL 50 MG PO TABS: 50.0000 mg | ORAL_TABLET | Freq: Every day | ORAL | Status: AC

## 2014-11-13 MED ORDER — SERTRALINE HCL 100 MG PO TABS: 100.0000 mg | ORAL_TABLET | Freq: Every day | ORAL | Status: AC

## 2014-11-13 NOTE — ED Notes (Signed)
Pt given breakfast tray

## 2014-11-13 NOTE — ED Provider Notes (Signed)
-----------------------------------------   5:28 PM on 11/13/2014 -----------------------------------------  Psychiatry has seen patient and feel that they are safe for discharge home.  Phineas SemenGraydon Raylan Hanton, MD 11/13/14 1728

## 2014-11-13 NOTE — ED Notes (Signed)
BEHAVIORAL HEALTH ROUNDING Patient sleeping: No. Patient alert and oriented: yes Behavior appropriate: Yes.  ; If no, describe:  Nutrition and fluids offered: Yes  Toileting and hygiene offered: Yes  Sitter present: not applicable Law enforcement present: Yes  

## 2014-11-13 NOTE — ED Notes (Signed)
Pt presents w/ c/o depression, panic attacks, insomnia, decreased appetite.
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
## 2014-11-13 NOTE — ED Notes (Signed)
BEHAVIORAL HEALTH ROUNDING Patient sleeping: No. Patient alert and oriented: yes Behavior appropriate: Yes.  ; If no, describe:  Nutrition and fluids offered: Yes  Toileting and hygiene offered: Yes  Sitter present: no Law enforcement present: Yes [facility] 

## 2014-11-13 NOTE — ED Notes (Signed)
BEHAVIORAL HEALTH ROUNDING Patient sleeping: No. Patient alert and oriented: yes Behavior appropriate: Yes.  ; If no, describe:  Nutrition and fluids offered: Yes  Toileting and hygiene offered: Yes  Sitter present: not applicable Law enforcement present: Yes 2 

## 2014-11-13 NOTE — ED Notes (Signed)

## 2014-11-13 NOTE — ED Provider Notes (Signed)
[facility] Emergency Department Provider Note  ____________________________________________  Time seen: Approximately 631 AM  I have reviewed the triage vital signs and the nursing notes.   HISTORY  Chief Complaint Depression and Medical Clearance    HPI Chloe GrovesMichelle B Morgan is a 44 y.o. female who comes in today feeling very anxious. She reports that she's been having a lot of panic episodes, not sleeping and having warm tingling sensations all over her body. She reports that she was here recently and reports that the symptoms are worseand they were then. Patient reports that when she was here she did see psych and was told that she had major depression. She reports that [facility] and does have an appointment to see a psychiatrist but is not until the end of the month. The patient started on Zoloft and has been taking it for the past 3 days but reports that she has not been feeling any improvement in her symptoms. She reports that she does feel depressed and very anxious but does not have any suicidal or homicidal ideation. She reports that she had a lot of situations going on such as with her living situation and the people who she lives with that causing her stress and making her worry constantly.   Past Medical History  Diagnosis Date  . Anxiety   . Depression     Patient Active Problem List   Diagnosis Date Noted  . MDD (major depressive disorder), recurrent episode, moderate 11/04/2014    Past Surgical History  Procedure Laterality Date  . Tonsillectomy    . Tubal ligation  2010    Current Outpatient Rx  Name  Route  Sig  Dispense  Refill  . sertraline (ZOLOFT) 50 MG tablet   Oral   Take 1 tablet (50 mg total) by mouth at bedtime.   30 tablet   2     Allergies Review of patient's allergies indicates no known allergies.  Family History  Problem Relation Age of Onset  . Cancer Mother   . Bipolar disorder Mother   . Breast  cancer Mother   . Cancer Father     Social History History  Substance Use Topics  . Smoking status: Former Smoker    Quit date: 11/03/1996  . Smokeless tobacco: Not on file  . Alcohol Use: Yes     Comment: once or twice a month    Review of Systems Constitutional: No fever/chills Eyes: Blurred vision ENT: No sore throat. Cardiovascular: chest pain/ tightness Respiratory: Denies shortness of breath. Gastrointestinal: Nausea no vomiting, no abdominal pain. Genitourinary: Negative for dysuria. Musculoskeletal: Negative for back pain. Skin: Negative for rash. Neurological: Headache   10-point ROS otherwise negative.  ____________________________________________   PHYSICAL EXAM:  VITAL SIGNS: ED Triage Vitals  Enc Vitals Group     BP 11/13/14 0541 138/90 mmHg     Pulse Rate 11/13/14 0541 81     Resp 11/13/14 0541 16     Temp 11/13/14 0541 97.7 F (36.5 C)     Temp Source 11/13/14 0541 Oral     SpO2 11/13/14 0541 97 %     Weight 11/13/14 0541 170 lb (77.111 kg)     Height 11/13/14 0541 5\' 6"  (1.676 m)     Head Cir --      Peak Flow --      Pain Score --      Pain Loc --      Pain Edu? --  Excl. in GC? --     Constitutional: Alert and oriented. Well appearing and in no acute distress. Eyes: Conjunctivae are normal. PERRL. EOMI. Head: Atraumatic. Nose: No congestion/rhinnorhea. Mouth/Throat: Mucous membranes are moist.  Oropharynx non-erythematous. Cardiovascular: Normal rate, regular rhythm. Grossly normal heart sounds.  Good peripheral circulation. Respiratory: Normal respiratory effort.  No retractions. Lungs CTAB. Gastrointestinal: Soft and nontender. No distention. Active bowel sounds Genitourinary: Deferred Musculoskeletal: No lower extremity tenderness nor edema.  No joint effusions. Neurologic:  Normal speech and language. No gross focal neurologic deficits are appreciated. Speech is normal. No gait instability. Skin:  Skin is warm, dry and intact.  No rash noted. Psychiatric: Sad mood. Speech and behavior are normal.  ____________________________________________   LABS (all labs ordered are listed, but only abnormal results are displayed)  Labs Reviewed  ACETAMINOPHEN LEVEL - Abnormal; Notable for the following:    Acetaminophen (Tylenol), Serum <10 (*)    All other components within normal limits  COMPREHENSIVE METABOLIC PANEL - Abnormal; Notable for the following:    Glucose, Bld 112 (*)    ALT 13 (*)    All other components within normal limits  CBC  ETHANOL  SALICYLATE LEVEL  URINE DRUG SCREEN, QUALITATIVE ([facility])   ____________________________________________  EKG  None ____________________________________________  RADIOLOGY  None ____________________________________________   PROCEDURES  Procedure(s) performed: None  Critical Care performed: No  ____________________________________________   INITIAL IMPRESSION / ASSESSMENT AND PLAN / ED COURSE  Pertinent labs & imaging results that were available during my care of the patient were reviewed by me and considered in my medical decision making (see chart for details).  The patient is a 44 year old female who comes in with feeling anxiety and depression increased from when she was seen previously. The patient is not suicidal or homicidal but we will have her evaluated by psychiatry to evaluate improved treatment regimens. ____________________________________________   FINAL CLINICAL IMPRESSION(S) / ED DIAGNOSES  Final diagnoses:  Anxiety  Depression       Chloe ApleyAllison P Jennier Schissler, MD 11/13/14 [phone]

## 2014-11-13 NOTE — ED Notes (Signed)
MD at bedside. 

## 2014-11-13 NOTE — ED Notes (Signed)
BEHAVIORAL HEALTH ROUNDING Patient sleeping: No. Patient alert and oriented: yes Behavior appropriate: Yes.  ; If no, describe:  Nutrition and fluids offered: Yes  Toileting and hygiene offered: YES Sitter present: NO Law enforcement present: Yes ODS

## 2014-11-13 NOTE — ED Notes (Signed)
Patient assigned to appropriate care area. Patient oriented to unit/care area: Informed that, for their safety, care areas are designed for safety and monitored by security cameras at all times; and visiting hours explained to patient. Patient verbalizes understanding, and verbal contract for safety obtained. 

## 2014-11-13 NOTE — Discharge Instructions (Signed)
Please seek medical attention for any thoughts of wanting to harm yourself or others, any significant or concerning change in behavior, or any other new or concerning symptoms.   Depression Depression refers to feeling sad, low, down in the dumps, blue, gloomy, or empty. In general, there are two kinds of depression: 1. Normal sadness or normal grief. This kind of depression is one that we all feel from time to time after upsetting life experiences, such as the loss of a job or the ending of a relationship. This kind of depression is considered normal, is short lived, and resolves within a few days to 2 weeks. Depression experienced after the loss of a loved one (bereavement) often lasts longer than 2 weeks but normally gets better with time. 2. Clinical depression. This kind of depression lasts longer than normal sadness or normal grief or interferes with your ability to function at home, at work, and in school. It also interferes with your personal relationships. It affects almost every aspect of your life. Clinical depression is an illness. Symptoms of depression can also be caused by conditions other than those mentioned above, such as:  Physical illness. Some physical illnesses, including underactive thyroid gland (hypothyroidism), severe anemia, specific types of cancer, diabetes, uncontrolled seizures, heart and lung problems, strokes, and chronic pain are commonly associated with symptoms of depression.  Side effects of some prescription medicine. In some people, certain types of medicine can cause symptoms of depression.  Substance abuse. Abuse of alcohol and illicit drugs can cause symptoms of depression. SYMPTOMS Symptoms of normal sadness and normal grief include the following:  Feeling sad or crying for short periods of time.  Not caring about anything (apathy).  Difficulty sleeping or sleeping too much.  No longer able to enjoy the things you used to enjoy.  Desire to be by  oneself all the time (social isolation).  Lack of energy or motivation.  Difficulty concentrating or remembering.  Change in appetite or weight.  Restlessness or agitation. Symptoms of clinical depression include the same symptoms of normal sadness or normal grief and also the following symptoms:  Feeling sad or crying all the time.  Feelings of guilt or worthlessness.  Feelings of hopelessness or helplessness.  Thoughts of suicide or the desire to harm yourself (suicidal ideation).  Loss of touch with reality (psychotic symptoms). Seeing or hearing things that are not real (hallucinations) or having false beliefs about your life or the people around you (delusions and paranoia). DIAGNOSIS  The diagnosis of clinical depression is usually based on how bad the symptoms are and how long they have lasted. Your health care provider will also ask you questions about your medical history and substance use to find out if physical illness, use of prescription medicine, or substance abuse is causing your depression. Your health care provider may also order blood tests. TREATMENT  Often, normal sadness and normal grief do not require treatment. However, sometimes antidepressant medicine is given for bereavement to ease the depressive symptoms until they resolve. The treatment for clinical depression depends on how bad the symptoms are but often includes antidepressant medicine, counseling with a mental health professional, or both. Your health care provider will help to determine what treatment is best for you. Depression caused by physical illness usually goes away with appropriate medical treatment of the illness. If prescription medicine is causing depression, talk with your health care provider about stopping the medicine, decreasing the dose, or changing to another medicine. Depression caused by the  abuse of alcohol or illicit drugs goes away when you stop using these substances. Some adults need  professional help in order to stop drinking or using drugs. SEEK IMMEDIATE MEDICAL CARE IF:  You have thoughts about hurting yourself or others.  You lose touch with reality (have psychotic symptoms).  You are taking medicine for depression and have a serious side effect. FOR MORE INFORMATION  National Alliance on Mental Illness: www.nami.[employer]  National Institute of Mental Health: http://www.maynard.net/www.nimh.nih.gov Document Released: 06/13/2000 Document Revised: 10/31/2013 Document Reviewed: 09/15/2011 [facility] Patient Information 2015 [place]. This information is not intended to replace advice given to you by your health care provider. Make sure you discuss any questions you have with your health care provider.
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
## 2014-11-13 NOTE — ED Notes (Signed)
Patient states that she has been feeling depressed times one week. Patient reports that she was seen here last Saturday for depression and that she has felt worse the last three days. Patient states that she has had inpatient therapy in the past for her depression and that she has a therapist but does not see them regularly. Patient denies SI. Patient states that she started taking zoloft on Friday. Patient reports that she has not been able to sleep and has had a poor appetite.

## 2014-11-13 NOTE — ED Notes (Signed)
ED BHU PLACEMENT JUSTIFICATION Is the patient under IVC or is there intent for IVC: No. Is the patient medically cleared: Yes.   Is there vacancy in the ED BHU: Yes.   Is the population mix appropriate for patient: Yes.   Is the patient awaiting placement in inpatient or outpatient setting: Yes.   Has the patient had a psychiatric consult: No. Survey of unit performed for contraband, proper placement and condition of furniture, tampering with fixtures in bathroom, shower, and each patient room: Yes.  ; Findings: all clear APPEARANCE/BEHAVIOR calm, cooperative and adequate rapport can be established NEURO ASSESSMENT Orientation: time, place and person Hallucinations: No.None noted (Hallucinations) Speech: Normal Gait: normal RESPIRATORY ASSESSMENT WNL CARDIOVASCULAR ASSESSMENT WNL GASTROINTESTINAL ASSESSMENT WNL EXTREMITIES ROM of all joints is normal PLAN OF CARE Provide calm/safe environment. Vital signs assessed twice daily. ED BHU Assessment once each 12-hour shift. Collaborate with intake RN daily or as condition indicates. Assure the ED provider has rounded once each shift. Provide and encourage hygiene. Provide redirection as needed. Assess for escalating behavior; address immediately and inform ED provider.  Assess family dynamic and appropriateness for visitation as needed: Yes.  ; If necessary, describe findings:  Educate the patient/family about BHU procedures/visitation: Yes.  ; If necessary, describe findings:

## 2015-06-13 ENCOUNTER — Encounter: Payer: Self-pay | Admitting: *Deleted

## 2015-06-13 ENCOUNTER — Ambulatory Visit
Admission: RE | Admit: 2015-06-13 | Discharge: 2015-06-13 | Disposition: A | Discharge status: Home / Self Care | Payer: Self-pay | Source: Ambulatory Visit | Attending: Oncology | Admitting: Oncology

## 2015-06-13 ENCOUNTER — Ambulatory Visit: Payer: Self-pay | Attending: Oncology | Admitting: *Deleted

## 2015-06-13 VITALS — BP 120/81 | HR 85 | Temp 97.6°F | Resp 20 | Ht 66.54 in | Wt 158.5 lb

## 2015-06-13 DIAGNOSIS — Z0000 Encounter for general adult medical examination without abnormal findings: Secondary | ICD-10-CM

## 2015-06-13 NOTE — Progress Notes (Signed)
Subjective:     Patient ID: Chloe GrovesMichelle B Morgan, female   DOB: 07/21/1970, 44 y.o.   MRN: [id number]  HPI   Review of Systems     Objective:   Physical Exam  Pulmonary/Chest: Right breast exhibits tenderness. Right breast exhibits no inverted nipple, no mass, no nipple discharge and no skin change. Left breast exhibits no inverted nipple, no mass, no nipple discharge and no tenderness. Breasts are symmetrical.    Complains of tenderness in the right upper outer quadrant of the right breast times a couple of months       Assessment:     44 year old White [facility]  Complains of right breast tenderness for a couple of months.  States it is intermittent, and only present when you touch it.  States she has increased her caffeine intake.  No dominant mass, skin changes or lymphadenopathy noted.  Taught self breast awareness.  Patient has been screened for eligibility.  She does not have any insurance, Medicare or Medicaid.  She also meets financial eligibility.  Hand-out given on the Affordable Care Act.     Plan:     Bilateral screening mammogram ordered.  Patient is to decrease caffeine intake, wear a good support bra, and try vitamin E.  Appointment scheduled by C. Azucena CecilBurton to return in 1 month to reassess breast tenderness.  If tenderness is unrelieved at that time will refer for surgical consultation.  Patient is agreeable.

## 2015-06-13 NOTE — Patient Instructions (Signed)
PATIENT INSTRUCTIONS FIBROCYSTIC BREAST DISEASE  FOLLOW-UP:  Please make an appointment with us for one month.   Call your physician or us if should you develop a new breast mass that is different, if one particular lump starts to enlarge, or if nipple discharge develops.   CAUSE:  Many women have some lumpiness within their breasts and these areas at times can become tender during certain times in your menstrual cycle.  These areas tend to feel like a firm rubber ball as compared to a cancer which will more commonly feel hard and almost rock-like.  Fibrocystic breast disease does not in and of itself increase your risk for breast cancer but you should be sure to examine yiour breasts at the same time of the month on a monthly basis.  If there are a lot of areas of lumpiness you should tape a piece of paper on the mirror with a diagram of your breasts, noting where the areas of lumpiness are and their relative size.  You can then refer to this diagram on a monthly basis to keep better track of any changes should they occur.  DIET:  You should try and avoid foods, or at least minimize foods, such as chocolate and caffeine which may cause the symptoms of tenderness to become worse.  ACTIVITY:  You may want to wear a bra that offers additional support, and/or consider a sports bra, especially during those times when your breasts are more tender.  MEDICATIONS:  Taking Vitamin E capsules twice a day along with Evening of Primrose Capsules three times a day, or as directed on the bottle, may help your symptoms.  These are both available over-the-counter and without a prescription. There is clinical evidence that these may help symptoms in some patients.   If your physician has prescribed medication for your fibrocystic breast disease, be sure to take it as instructed on the bottle and let him/her know if you have any side effects.  QUESTIONS:  Please feel free to call your physician  if you have any  questions, and they will be glad to assist you.

## 2015-06-21 ENCOUNTER — Encounter: Payer: Self-pay | Admitting: *Deleted

## 2015-06-21 NOTE — Progress Notes (Signed)
Letter mailed from the [facility] to inform patient of her normal mammogram results.  Patient is to follow-up with clinical breast exam in one month and annual screening in one year.  [facility]

## 2015-07-18 ENCOUNTER — Ambulatory Visit: Payer: Self-pay | Attending: Oncology

## 2017-03-31 ENCOUNTER — Emergency Department
Admission: EM | Admit: 2017-03-31 | Discharge: 2017-03-31 | Disposition: A | Discharge status: Home / Self Care | Payer: Self-pay | Attending: Emergency Medicine | Admitting: Emergency Medicine

## 2017-03-31 ENCOUNTER — Encounter: Payer: Self-pay | Admitting: *Deleted

## 2017-03-31 DIAGNOSIS — Z79899 Other long term (current) drug therapy: Secondary | ICD-10-CM | POA: Insufficient documentation

## 2017-03-31 DIAGNOSIS — M542 Cervicalgia: Secondary | ICD-10-CM | POA: Insufficient documentation

## 2017-03-31 DIAGNOSIS — Z87891 Personal history of nicotine dependence: Secondary | ICD-10-CM | POA: Insufficient documentation

## 2017-03-31 DIAGNOSIS — I889 Nonspecific lymphadenitis, unspecified: Secondary | ICD-10-CM | POA: Insufficient documentation

## 2017-03-31 MED ORDER — IBUPROFEN 800 MG PO TABS: 800.0000 mg | ORAL_TABLET | Freq: Three times a day (TID) | ORAL | 0 refills | Status: AC | PRN

## 2017-03-31 MED ORDER — AZITHROMYCIN 250 MG PO TABS: ORAL_TABLET | ORAL | 0 refills | Status: AC

## 2017-03-31 NOTE — ED Notes (Signed)
Pt states left neck "soreness and tenderness" since Friday, awake and alert, ambulatory In no acute distress, denies being sick recently

## 2017-03-31 NOTE — ED Provider Notes (Signed)
[facility] Emergency Department Provider Note   ____________________________________________   I have reviewed the triage vital signs and the nursing notes.   HISTORY  Chief Complaint Neck Pain    HPI Chloe Morgan is a 46 y.o. female presents to emergency department with left side soreness and tenderness along the neck that began 3 days ago. Patient feels like the lymph nodes along the left side of her neck are swollen Patient denies any recent illness, sinus infection, upper respiratory infection, dental infection or dental work related to current symptoms. Patient reports feeling generally mildly fatigued otherwise she offers no other symptoms. Patient's coworkers encouraged her to come and have symptoms assessed of the emergency department. Patient reports having a primary care provider although she is not followed up with him in quite some time. Patient has a history of high cholesterol although she has not been taking cholesterol medication regularly due to not following up with primary care. Patient denies fever, chills, headache, vision changes, chest pain, chest tightness, shortness of breath, abdominal pain, nausea and vomiting.  Past Medical History:  Diagnosis Date  . Anxiety   . Depression     Patient Active Problem List   Diagnosis Date Noted  . Severe major depression, single episode, without psychotic features (HCC)   . MDD (major depressive disorder), recurrent episode, moderate (HCC) 11/04/2014    Past Surgical History:  Procedure Laterality Date  . HAND SURGERY    . TONSILLECTOMY    . TUBAL LIGATION  2010    Prior to Admission medications   Medication Sig Start Date End Date Taking? Authorizing Provider  azithromycin (ZITHROMAX Z-PAK) 250 MG tablet Take 2 tablets (500 mg) on  Day 1,  followed by 1 tablet (250 mg) once daily on Days 2 through 5. 03/31/17 04/05/17  Aleria Maheu M, PA-C  ibuprofen (ADVIL,MOTRIN) 800 MG tablet Take  1 tablet (800 mg total) by mouth every 8 (eight) hours as needed. 03/31/17   Annalaura Sauseda M, PA-C  sertraline (ZOLOFT) 100 MG tablet Take 1 tablet (100 mg total) by mouth daily. 11/13/14 11/13/15  Phineas Semen, MD  traZODone (DESYREL) 50 MG tablet Take 1 tablet (50 mg total) by mouth at bedtime. 11/13/14   Phineas Semen, MD    Allergies Patient has no known allergies.  Family History  Problem Relation Age of Onset  . Cancer Mother   . Bipolar disorder Mother   . Breast cancer Mother        17's  . Cancer Father     Social History Social History  Substance Use Topics  . Smoking status: Former Smoker    Quit date: 11/03/1996  . Smokeless tobacco: Never Used  . Alcohol use Yes     Comment: once or twice a month    Review of Systems Constitutional: Negative for fever/chills Eyes: No visual changes. ENT:  Negative for sore throat and for difficulty swallowing. Tenderness to palpation along left neck.  Cardiovascular: Denies chest pain. Respiratory: Denies cough. Denies shortness of breath. Gastrointestinal: No abdominal pain.  No nausea, vomiting, diarrhea.. Musculoskeletal: Negative for back pain. Skin: Negative for rash. Neurological: Negative for headaches.  ____________________________________________   PHYSICAL EXAM:  VITAL SIGNS: ED Triage Vitals  Enc Vitals Group     BP 03/31/17 1320 (!) 152/93     Pulse Rate 03/31/17 1320 83     Resp 03/31/17 1320 16     Temp 03/31/17 1320 98 F (36.7 C)  Temp Source 03/31/17 1320 Oral     SpO2 03/31/17 1320 99 %     Weight 03/31/17 1321 190 lb (86.2 kg)     Height 03/31/17 1321  (1.676 m)     Head Circumference --      Peak Flow --      Pain Score 03/31/17 1319 5     Pain Loc --      Pain Edu? --      Excl. in GC? --     Constitutional: Alert and oriented. Well appearing and in no acute distress.  Eyes: Conjunctivae are normal. PERRL. EOMI  Head: Normocephalic and atraumatic. ENT:      Ears: Canals clear.  TMs intact bilaterally.      Nose: No congestion/rhinnorhea.      Mouth/Throat: Mucous membranes are moist. Oropharynx normal. Tonsils bilaterally symmetrical without swelling. Neck:Supple. No thyromegaly. No stridor.  Cardiovascular: Normal rate, regular rhythm. Good peripheral circulation. Respiratory: Normal respiratory effort without tachypnea or retractions.  Hematological/Lymphatic/Immunological: Left side cervical lymphadenopathy. Cardiovascular: Normal rate, regular rhythm. Normal distal pulses. Gastrointestinal: Bowel sounds 4 quadrants. Soft and nontender to palpation.  Neurologic: Normal speech and language. Skin:  Skin is warm, dry and intact. No rash noted. Psychiatric: Mood and affect are normal. Speech and behavior are normal. Patient exhibits appropriate insight and judgement.  ____________________________________________   LABS (all labs ordered are listed, but only abnormal results are displayed)  Labs Reviewed - No data to display ____________________________________________  EKG none ____________________________________________  RADIOLOGY none ____________________________________________   PROCEDURES  Procedure(s) performed: no    Critical Care performed: no ____________________________________________   INITIAL IMPRESSION / ASSESSMENT AND PLAN / ED COURSE  Pertinent labs & imaging results that were available during my care of the patient were reviewed by me and considered in my medical decision making (see chart for details).  Patient presents to emergency department with left side neck pain with probable tenderness along left cervical lymph nodes.. History, physical exam findings are consistent with lymphadenitis. Patient will be prescribed zithromycin and ibuprofen. Patient has been out of care with her primary care provider and has not been taking her cholesterol medicine for greater than 1 year per patient  report.Patient advised to follow up  with PCP as soon as she can schedule an appointment for a complete physical or return to the emergency department if symptoms return or worsen. Patient informed of clinical course, understand medical decision-making process, and agree with plan. ____________________________________________   FINAL CLINICAL IMPRESSION(S) / ED DIAGNOSES  Final diagnoses:  Neck pain  Lymphadenitis       NEW MEDICATIONS STARTED DURING THIS VISIT:  Discharge Medication List as of 03/31/2017  2:31 PM    START taking these medications   Details  azithromycin (ZITHROMAX Z-PAK) 250 MG tablet Take 2 tablets (500 mg) on  Day 1,  followed by 1 tablet (250 mg) once daily on Days 2 through 5., Print    ibuprofen (ADVIL,MOTRIN) 800 MG tablet Take 1 tablet (800 mg total) by mouth every 8 (eight) hours as needed., Starting Tue 03/31/2017, Print         Note:  This document was prepared using Dragon voice recognition software and may include unintentional dictation errors.    Percell Boston 03/31/17 1913    Jene Every, MD 04/03/17 [phone]

## 2017-03-31 NOTE — Discharge Instructions (Signed)
Take medication as prescribed. Return to emergency department if symptoms worsen and follow-up with PCP as needed.   °

## 2017-03-31 NOTE — ED Triage Notes (Signed)
Pt c/o swollen lymph nodes on left side of neck x 3 days. Pain 5/10 with pressure and soreness. No recent illness, feels generally tired and weak.

## 2018-01-06 ENCOUNTER — Ambulatory Visit: Payer: Self-pay | Attending: Oncology

## 2018-01-06 ENCOUNTER — Ambulatory Visit
Admission: RE | Admit: 2018-01-06 | Discharge: 2018-01-06 | Disposition: A | Discharge status: Home / Self Care | Payer: Self-pay | Source: Ambulatory Visit | Attending: Oncology | Admitting: Oncology

## 2018-01-06 ENCOUNTER — Encounter (INDEPENDENT_AMBULATORY_CARE_PROVIDER_SITE_OTHER): Payer: Self-pay

## 2018-01-06 VITALS — BP 110/73 | HR 88 | Temp 97.9°F | Ht 65.0 in | Wt 192.0 lb

## 2018-01-06 DIAGNOSIS — Z0000 Encounter for general adult medical examination without abnormal findings: Secondary | ICD-10-CM

## 2018-01-06 NOTE — Progress Notes (Signed)
  Subjective:     Patient ID: Chloe Morgan, female   DOB: 12/29/1970, 47 y.o.   MRN: [id number]  HPI   Review of Systems     Objective:   Physical Exam  Pulmonary/Chest: Right breast exhibits no inverted nipple, no mass, no nipple discharge, no skin change and no tenderness. Left breast exhibits no inverted nipple, no mass, no nipple discharge, no skin change and no tenderness. Breasts are symmetrical.       Assessment:    47 [facility]  Patient screened, and meets [facility] eligibility.  Patient does not have insurance, Medicare or Medicaid.  Handout given on Affordable Care Act.  Instructed patient on breast self awareness using teach back method.  Clinical breast exam unremarkable.   No mass or lump palpated.  Patient is perimenopausal.  Her periods are not regular.  She is "spotting today" .    Plan:     Sent for bilateral screening mammogram.  Scheduled to return 01/20/18 at 11:00 for pap.

## 2018-01-12 NOTE — Progress Notes (Signed)
Letter mailed from [facility] to notify of normal mammogram results.  Patient to return in one year for annual screening.  Copy to [facility]. 

## 2018-01-20 ENCOUNTER — Ambulatory Visit: Payer: Self-pay | Attending: Oncology | Admitting: *Deleted

## 2018-01-20 ENCOUNTER — Encounter: Payer: Self-pay | Admitting: *Deleted

## 2018-01-20 VITALS — BP 125/85 | HR 96 | Temp 97.7°F

## 2018-01-20 DIAGNOSIS — Z0000 Encounter for general adult medical examination without abnormal findings: Secondary | ICD-10-CM

## 2018-01-20 NOTE — Patient Instructions (Signed)
HPV Test The human papillomavirus (HPV) test is used to look for high-risk types of HPV infection. HPV is a group of about 100 viruses. Many of these viruses cause growths on, in, or around the genitals. Most HPV viruses cause infections that usually go away without treatment. However, HPV types 6, 11, 16, and 18 are considered high-risk types of HPV that can increase your risk of cancer of the cervix or anus if the infection is left untreated. An HPV test identifies the DNA (genetic) strands of the HPV infection, so it is also referred to as the HPV DNA test. Although HPV is found in both males and females, the HPV test is only used to screen for increased cancer risk in females:  With an abnormal Pap test.  After treatment of an abnormal Pap test.  Between the ages of 30 and 65.  After treatment of a high-risk HPV infection.  The HPV test may be done at the same time as a pelvic exam and Pap test in females over the age of 30. Both the HPV test and Pap test require a sample of cells from the cervix. How do I prepare for this test?  Do not douche or take a bath for 24-48 hours before the test or as directed by your health care provider.  Do not have sex for 24-48 hours before the test or as directed by your health care provider.  You may be asked to reschedule the test if you are menstruating.  You will be asked to urinate before the test. What do the results mean? It is your responsibility to obtain your test results. Ask the lab or department performing the test when and how you will get your results. Talk with your health care provider if you have any questions about your results. Your result will be negative or positive. Meaning of Negative Test Results A negative HPV test result means that no HPV was found, and it is very likely that you do not have HPV. Meaning of Positive Test Results A positive HPV test result indicates that you have HPV.  If your test result shows the presence  of any high-risk HPV strains, you may have an increased risk of developing cancer of the cervix or anus if the infection is left untreated.  If any low-risk HPV strains are found, you are not likely to have an increased risk of cancer.  Discuss your test results with your health care provider. He or she will use the results to make a diagnosis and determine a treatment plan that is right for you. Talk with your health care provider to discuss your results, treatment options, and if necessary, the need for more tests. Talk with your health care provider if you have any questions about your results. This information is not intended to replace advice given to you by your health care provider. Make sure you discuss any questions you have with your health care provider. Document Released: 07/11/2004 Document Revised: 02/20/2016 Document Reviewed: 11/01/2013 Elsevier Interactive Patient Education  2018 Elsevier Inc.  

## 2018-01-20 NOTE — Progress Notes (Signed)
  Subjective:     Patient ID: Chloe Morgan, female   DOB: 10/24/1970, 47 y.o.   MRN: [id number]  HPI   Review of Systems     Objective:   Physical Exam  Genitourinary: No labial fusion. There is no rash, tenderness, lesion or injury on the right labia. There is no rash, tenderness, lesion or injury on the left labia. Cervix exhibits no motion tenderness, no discharge and no friability. Right adnexum displays no mass, no tenderness and no fullness. Left adnexum displays no mass, no tenderness and no fullness. No erythema, tenderness or bleeding in the vagina. No foreign body in the vagina. No signs of injury around the vagina. No vaginal discharge found.         Assessment:     [facility]  Mammogram on 01/06/18 was a birads 1.  On pelvic exam there is a notable cervical polyp with 2 nabothian cysts like lesions.  Specimen collected for pap smear without difficulty.  Patient has been screened for eligibility.  States she is having some urinary incontinence and urgency.  States "I can go to the bathroom and in just a few minutes, I have to go again."  Taught Kegel exercises.  She does not have any insurance, Medicare or Medicaid.  She also meets financial eligibility.  Hand-out given on the Affordable Care Act.    Plan:     Specimen for pap sent to the lab.  Will schedule patient for GYN consult for the cervical polyp for a few weeks from now to ensure we have her pap results back prior to her appointment.  Will follow-up per [facility] protocol.

## 2018-01-22 LAB — PAP LB AND HPV HIGH-RISK
HPV, high-risk: NEGATIVE
PAP SMEAR COMMENT: 0

## 2018-02-01 ENCOUNTER — Encounter: Payer: Self-pay | Admitting: *Deleted

## 2018-02-01 NOTE — Progress Notes (Signed)
Left patient a message to return my call.  I would like to inform her normal pap results, but the importance of keeping her appointment with Dr. Jerene PitchSchuman in regards to the cervical polyp.

## 2018-02-01 NOTE — Progress Notes (Signed)
Patient returned my and requested I leave her a voicemail message.  Informed patient of her normal pap smear and mammogram.  Encouraged to keep her scheduled appointment with GYN to evaluate the cervical polyp.

## 2018-02-08 ENCOUNTER — Ambulatory Visit (INDEPENDENT_AMBULATORY_CARE_PROVIDER_SITE_OTHER): Payer: Self-pay | Admitting: Obstetrics and Gynecology

## 2018-02-08 ENCOUNTER — Other Ambulatory Visit (HOSPITAL_COMMUNITY)
Admission: RE | Admit: 2018-02-08 | Discharge: 2018-02-08 | Disposition: A | Discharge status: Home / Self Care | Payer: Self-pay | Source: Ambulatory Visit | Attending: Obstetrics and Gynecology | Admitting: Obstetrics and Gynecology

## 2018-02-08 ENCOUNTER — Encounter: Payer: Self-pay | Admitting: Obstetrics and Gynecology

## 2018-02-08 ENCOUNTER — Telehealth: Payer: Self-pay | Admitting: Obstetrics and Gynecology

## 2018-02-08 VITALS — BP 110/60 | Ht 65.0 in | Wt 192.0 lb

## 2018-02-08 DIAGNOSIS — N841 Polyp of cervix uteri: Secondary | ICD-10-CM

## 2018-02-08 DIAGNOSIS — N8500 Endometrial hyperplasia, unspecified: Secondary | ICD-10-CM | POA: Insufficient documentation

## 2018-02-08 DIAGNOSIS — N950 Postmenopausal bleeding: Secondary | ICD-10-CM | POA: Insufficient documentation

## 2018-02-08 NOTE — Progress Notes (Signed)
Patient ID: Chloe Morgan, female   DOB: 04/14/1971, 47 y.o.   MRN: [id number]  Reason for Consult: Gynecologic Exam (cervical polyp)   Referred by Judeen HammansSoles, Meredith Key, MD  Subjective:     HPI:  Chloe Morgan is a 47 y.o. female Chloe Morgan is a 47 year old patient who presents today for an abnormal cervical polyp which was seen by the [facility] program.  She had a normal Pap smear on 01/20/18.  She reports that she entered menopause about a year and a half ago.  She went more than a year without a menstrual cycle.  She reports that last month, in July, she had a week of bleeding.  She denies any spotting or any bleeding after intercourse.   Past Medical History:  Diagnosis Date  . Anxiety   . Depression    Family History  Problem Relation Age of Onset  . Cancer Mother   . Bipolar disorder Mother   . Breast cancer Mother        2150's  . Cancer Father    Past Surgical History:  Procedure Laterality Date  . HAND SURGERY    . TONSILLECTOMY    . TUBAL LIGATION  2010    Short Social History:  Social History   Tobacco Use  . Smoking status: Former Smoker    Last attempt to quit: 11/03/1996    Years since quitting: 21.2  . Smokeless tobacco: Never Used  Substance Use Topics  . Alcohol use: Yes    Comment: once or twice a month    No Known Allergies  Current Outpatient Medications  Medication Sig Dispense Refill  . buPROPion (ZYBAN) 150 MG 12 hr tablet Take 150 mg by mouth 2 (two) times daily.    Marland Kitchen. ibuprofen (ADVIL,MOTRIN) 800 MG tablet Take 1 tablet (800 mg total) by mouth every 8 (eight) hours as needed. 30 tablet 0  . lisinopril (PRINIVIL,ZESTRIL) 10 MG tablet Take 10 mg by mouth daily.    . traZODone (DESYREL) 50 MG tablet Take 1 tablet (50 mg total) by mouth at bedtime. 30 tablet 0  . sertraline (ZOLOFT) 100 MG tablet Take 1 tablet (100 mg total) by mouth daily. 30 tablet 0  . Sertraline HCl (ZOLOFT PO) Zoloft     No current facility-administered medications for  this visit.     Review of Systems  Constitutional: Negative for chills, fatigue, fever and unexpected weight change.  HENT: Negative for trouble swallowing.  Eyes: Negative for loss of vision.  Respiratory: Negative for cough, shortness of breath and wheezing.  Cardiovascular: Negative for chest pain, leg swelling, palpitations and syncope.  GI: Negative for abdominal pain, blood in stool, diarrhea, nausea and vomiting.  GU: Negative for difficulty urinating, dysuria, frequency and hematuria.  Musculoskeletal: Negative for back pain, leg pain and joint pain.  Skin: Negative for rash.  Neurological: Negative for dizziness, headaches, light-headedness, numbness and seizures.  Psychiatric: Negative for behavioral problem, confusion, depressed mood and sleep disturbance.        Objective:  Objective   Vitals:   02/08/18 1333  BP: 110/60  Weight: 192 lb (87.1 kg)  Height: 5\' 5"  (1.651 m)   Body mass index is 31.95 kg/m.  Physical Exam  Constitutional: She is oriented to person, place, and time. She appears well-developed and well-nourished.  HENT:  Head: Normocephalic and atraumatic.  Eyes: Pupils are equal, round, and reactive to light. EOM are normal.  Cardiovascular: Normal rate and regular rhythm.  Pulmonary/Chest:  Effort normal. No respiratory distress.  Abdominal: Soft. She exhibits no distension. There is no tenderness.  Genitourinary:  Genitourinary Comments: Small cervical polyp present on examination, otherwise grossly normal cervix.  Normal uterus, no adnexal fullness or masses appreciated.   Neurological: She is alert and oriented to person, place, and time.  Skin: Skin is warm and dry.  Psychiatric: She has a normal mood and affect. Her behavior is normal. Judgment and thought content normal.  Nursing note and vitals reviewed.    Removal of Cervical Polyp and Endometrial Biopsy After discussion with the patient regarding her abnormal uterine bleeding I  recommended that she proceed with an endometrial biopsy for further diagnosis. The risks, benefits, alternatives, and indications for an endometrial biopsy were discussed with the patient in detail. She understood the risks including infection, bleeding, cervical laceration and uterine perforation.  Verbal consent was obtained.   PROCEDURE NOTE:   Tenaculum used to clamp cervical polyp, twist and remove.  Pipelle endometrial biopsy was then performed using aseptic technique with iodine preparation.  The uterus was sounded to a length of 9 cm.  Adequate sampling was obtained with minimal blood loss.  The patient tolerated the procedure well.  Disposition will be pending pathology.       Assessment/Plan:     1. 47 yo with postmenopausal bleeding and cervical polyp seen on pelvic examination.  Endometrial biopsy and removal of cervical polyp performed. Will follow up with results.   Adelene Idlerhristanna Elster Corbello MD [facility], [facility] 02/08/18 2:14 PM

## 2018-02-08 NOTE — Telephone Encounter (Signed)
She is calling to confirm what the specimen are that have been sent and also to find out how many. She is showing report for 3 but only has two.Chloe Morgan. Please advise . [facility] [id number]

## 2018-02-08 NOTE — Telephone Encounter (Signed)
Pathology aware of 1 emb and 1 cervical polyp

## 2018-02-15 ENCOUNTER — Encounter: Payer: Self-pay | Admitting: *Deleted

## 2018-02-15 NOTE — Progress Notes (Signed)
Patient with benign cervical polyp.  Follow-up per [facility] guidelines.

## 2018-05-10 NOTE — Progress Notes (Signed)
Called and left patient a message to return my call. 16:50

## 2018-05-14 NOTE — Progress Notes (Signed)
Called patient 05/14/18 and left message. [facility] will help with covering another biopsy in several months after she starts treatment.  Asked her to call office so we can discuss.

## 2018-05-17 ENCOUNTER — Telehealth: Payer: Self-pay

## 2018-05-17 NOTE — Telephone Encounter (Signed)
Please advise. Thank you

## 2018-05-17 NOTE — Telephone Encounter (Signed)
Pt wants [facility] to know [facility]  They have not received any information about the IUD.  Please call pt.  [phone]

## 2018-05-17 NOTE — Telephone Encounter (Signed)
She called and spoke with Alanson AlyJulie Porter at [facility]- ext. [phone].  [facility] was confused and [facility].  I called and spoke to MasonJulie who said she would let a nurse know and they would contact the patient and schedule her for a Mirena IUD insertion for endometrial hyperplasia.

## 2018-05-17 NOTE — Progress Notes (Signed)
[facility] is [facility]. She is aware [facility] will help pay for another uterine biopsy in the future.

## 2018-06-03 NOTE — Telephone Encounter (Signed)
Elon JesterKrishna Kotary from [facility] calling.  She has questions regarding recommendation for IUD and also want to confirm IUD still needed after endometrial bx results.  Please call her at [phone]

## 2018-06-03 NOTE — Telephone Encounter (Signed)
She needs the IUD to treat hyperplasia. I tried to call bey hung up on me. Please call them back.

## 2018-06-03 NOTE — Telephone Encounter (Signed)
Please advise. I can call her with information

## 2018-06-03 NOTE — Telephone Encounter (Signed)
Called and spoke with her, she is going to order the IUD now.

## 2019-01-20 ENCOUNTER — Other Ambulatory Visit: Payer: Self-pay

## 2019-01-20 DIAGNOSIS — Z20822 Contact with and (suspected) exposure to covid-19: Secondary | ICD-10-CM

## 2019-01-23 LAB — NOVEL CORONAVIRUS, NAA: SARS-CoV-2, NAA: NOT DETECTED

## 2020-03-07 ENCOUNTER — Ambulatory Visit: Payer: Self-pay | Attending: Oncology | Admitting: *Deleted

## 2020-03-07 ENCOUNTER — Other Ambulatory Visit: Payer: Self-pay

## 2020-03-07 ENCOUNTER — Ambulatory Visit
Admission: RE | Admit: 2020-03-07 | Discharge: 2020-03-07 | Disposition: A | Discharge status: Home / Self Care | Payer: Self-pay | Source: Ambulatory Visit | Attending: Oncology | Admitting: Oncology

## 2020-03-07 VITALS — BP 111/70 | HR 69 | Temp 98.4°F | Ht 65.0 in | Wt 181.0 lb

## 2020-03-07 DIAGNOSIS — Z0000 Encounter for general adult medical examination without abnormal findings: Secondary | ICD-10-CM

## 2020-03-07 NOTE — Patient Instructions (Signed)
Gave patient hand-out, Women Staying Healthy, Active and Well from [facility], with education on breast health, pap smears, heart and colon health. 

## 2020-03-07 NOTE — Progress Notes (Signed)
  Subjective:     Patient ID: Chloe Morgan, female   DOB: [birth year], 49 y.o.   MRN: [id number]  HPI   Review of Systems     Objective:   Physical Exam Chest:     Breasts:        Right: No swelling, bleeding, inverted nipple, mass, nipple discharge, skin change or tenderness.        Left: No swelling, bleeding, inverted nipple, mass, nipple discharge, skin change or tenderness.    Lymphadenopathy:     Upper Body:     Right upper body: No supraclavicular or axillary adenopathy.     Left upper body: No supraclavicular or axillary adenopathy.        Assessment:     49 year old female returns to [facility] for annual screening.  Clinical breast exam without dominant mass, skin changes, nipple discharge or lymphadenopathy.  Taught self breast awareness.  Patient's mom had breast cancer in her late 44's to early 76's.  Encouraged patient to get annual screening mammograms.  She is agreeable.  Last pap on 01/20/18 was negative / negative.  Next pap due in 2024.  Patient has been screened for eligibility.  She does not have any insurance, Medicare or Medicaid.  She also meets financial eligibility.   Risk Assessment    Risk Scores      03/07/2020 01/06/2018   Last edited by: Alta Corning, CMA Jim Like, RN   5-year risk: 1.8 % 1.7 %   Lifetime risk: 16 % 16.5 %            Plan:     Screening mammogram ordered.  Will follow up per [facility] protocol.

## 2020-03-28 ENCOUNTER — Encounter: Payer: Self-pay | Admitting: *Deleted

## 2020-03-28 NOTE — Progress Notes (Signed)
Letter mailed from the [facility] to inform patient of her normal mammogram results.  Patient is to follow-up with annual screening in one year. 

## 2021-04-13 IMAGING — MG DIGITAL SCREENING BILAT W/ TOMO W/ CAD
8 series · 8 of 24 positions shown · non-contrast
Comparison: Previous exam(s).

CLINICAL DATA: Screening.

EXAM:
DIGITAL SCREENING BILATERAL MAMMOGRAM WITH TOMO AND CAD

[R MLO synth-2D]
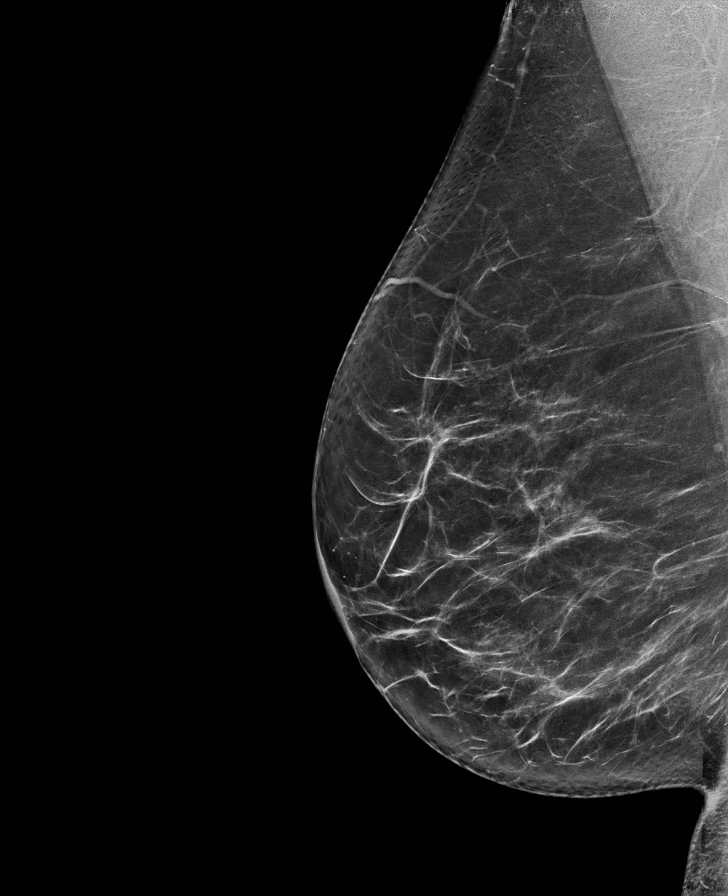

[L CC synth-2D]
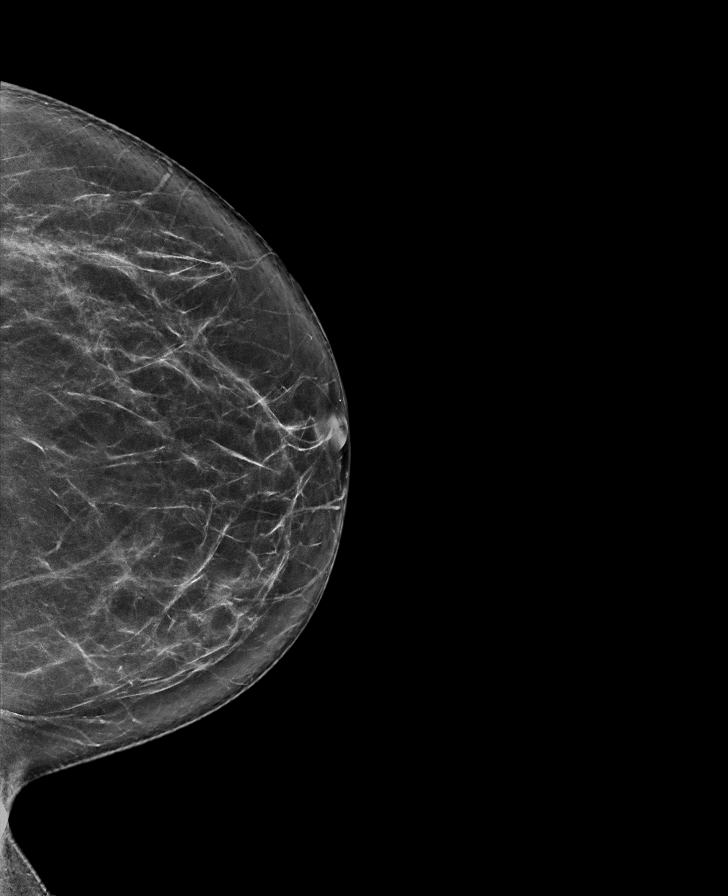

[R CC synth-2D]
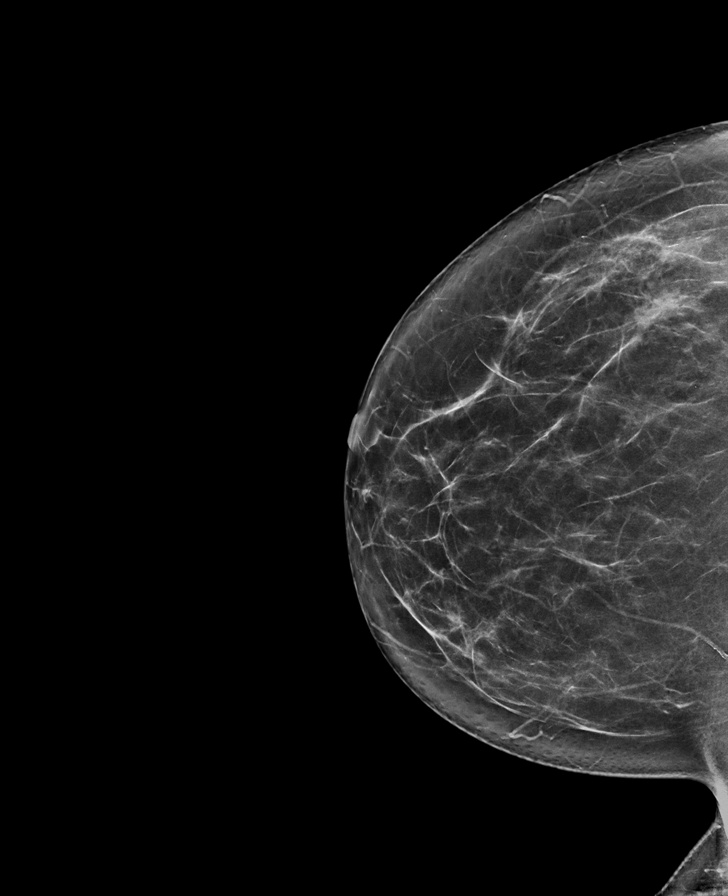

[L MLO synth-2D]
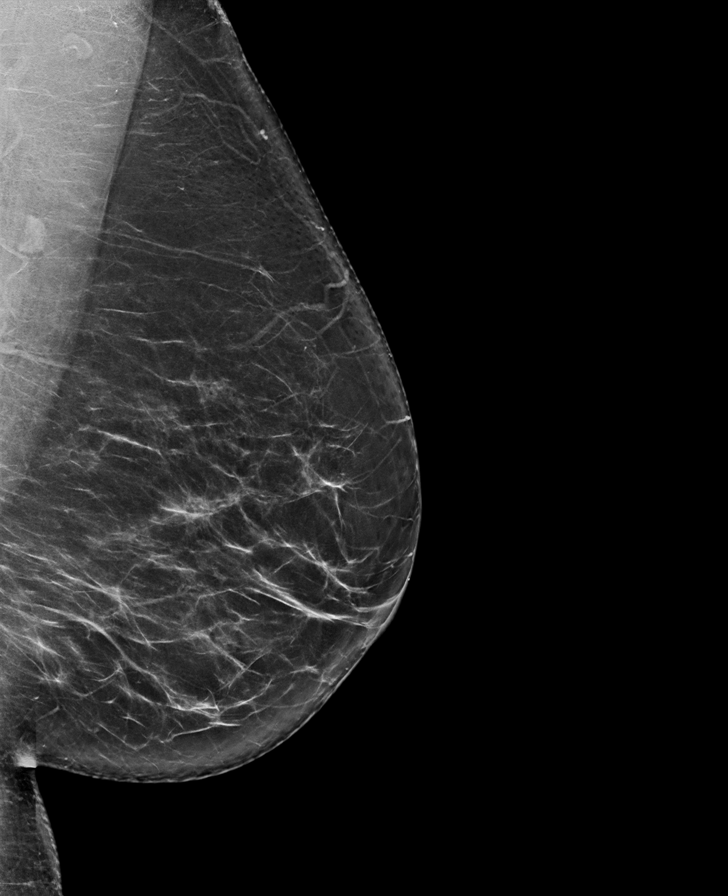

[R CC tomo · tomo slice 40/79.0]
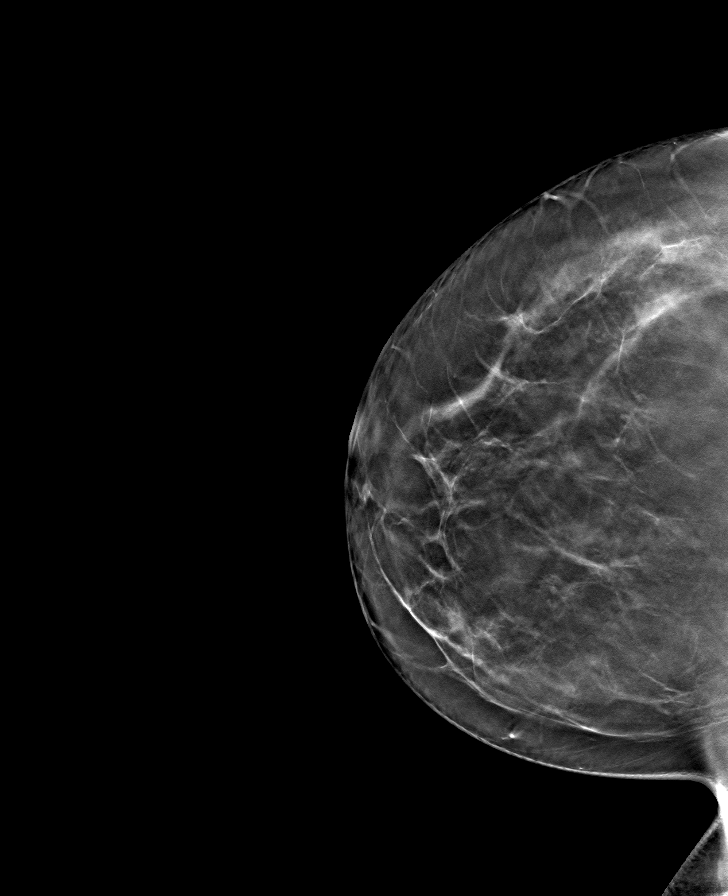

[L MLO tomo · tomo slice 43/85.0]
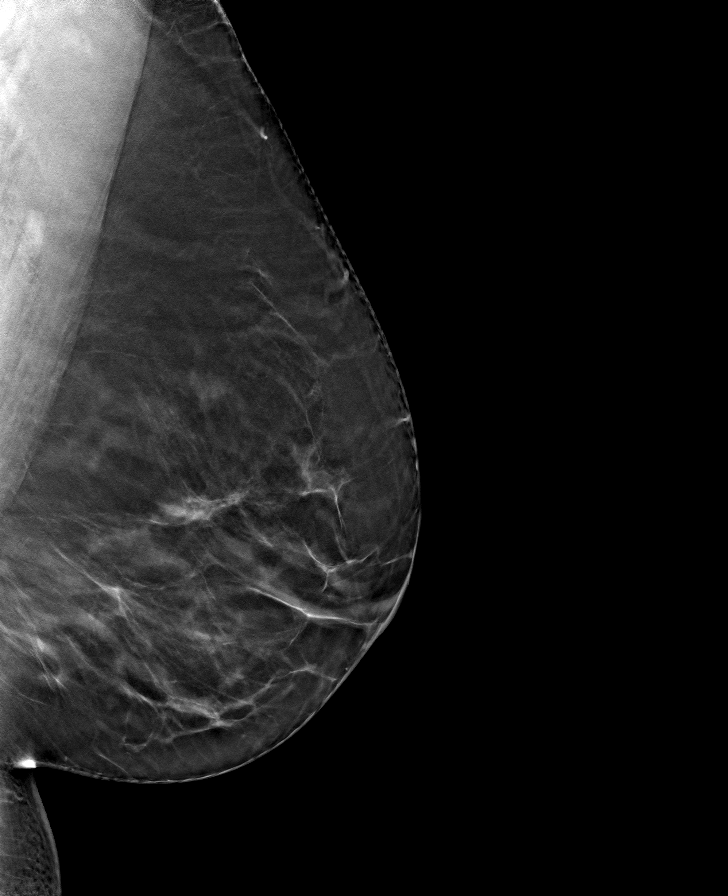

[L CC tomo · tomo slice 40/79.0]
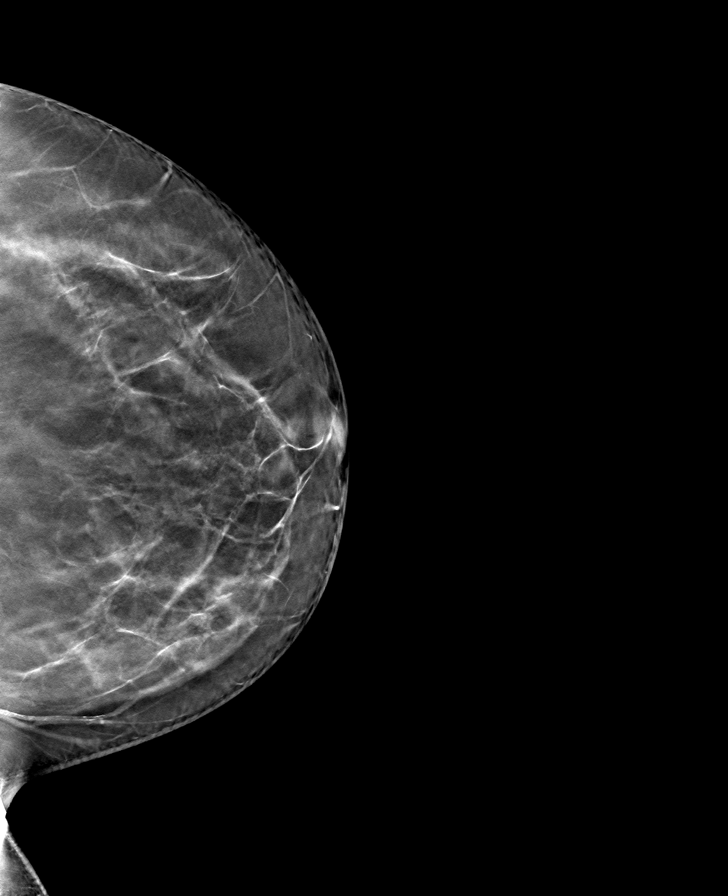

[R MLO tomo · tomo slice 43/85.0]
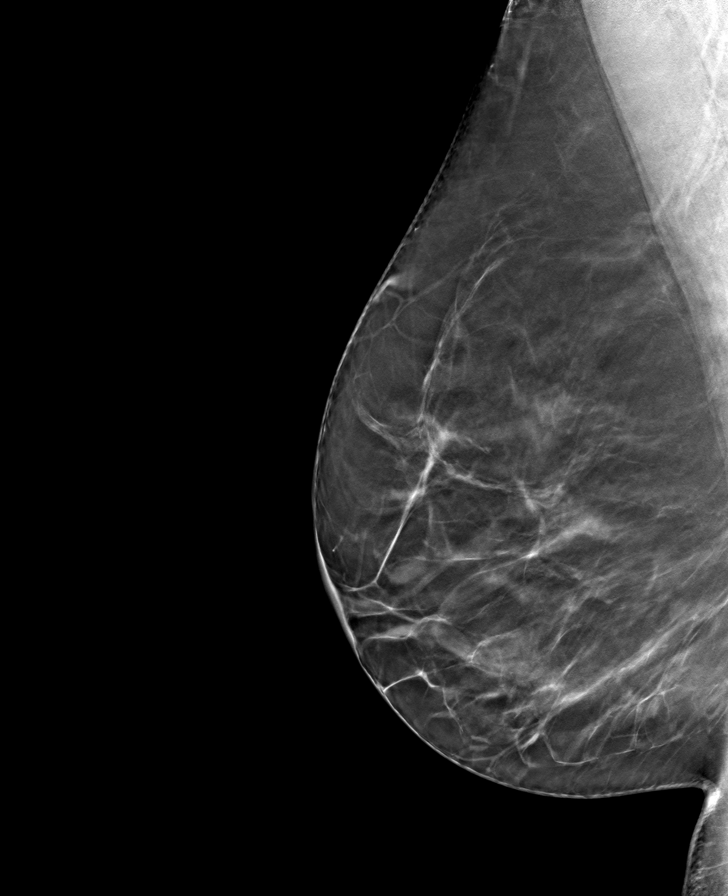

[8 of 24 positions shown; findings below may reference images not displayed]

ACR Breast Density Category b: There are scattered areas of
fibroglandular density.
FINDINGS: There are no findings suspicious for malignancy. Images were
processed with CAD.
IMPRESSION: No mammographic evidence of malignancy. A result letter of this
screening mammogram will be mailed directly to the patient.

RECOMMENDATION:
Screening mammogram in one year. (Code:[id number])

BI-RADS CATEGORY  1: Negative.
